# Patient Record
Sex: Female | Born: 1980 | Race: White | Hispanic: No | Marital: Married | Smoking: Never smoker
Health system: Southern US, Community
[De-identification: ages and names within clinical notes are randomized; demographics above are authoritative.]

## PROBLEM LIST (undated history)

## (undated) DIAGNOSIS — J45909 Unspecified asthma, uncomplicated: Secondary | ICD-10-CM

## (undated) DIAGNOSIS — A609 Anogenital herpesviral infection, unspecified: Secondary | ICD-10-CM

## (undated) HISTORY — DX: Anogenital herpesviral infection, unspecified: A60.9

## (undated) HISTORY — DX: Unspecified asthma, uncomplicated: J45.909

---

## 2016-10-08 ENCOUNTER — Ambulatory Visit (INDEPENDENT_AMBULATORY_CARE_PROVIDER_SITE_OTHER): Payer: Medicaid Other | Admitting: *Deleted

## 2016-10-08 DIAGNOSIS — Z3201 Encounter for pregnancy test, result positive: Secondary | ICD-10-CM | POA: Diagnosis not present

## 2016-10-08 DIAGNOSIS — N912 Amenorrhea, unspecified: Secondary | ICD-10-CM | POA: Diagnosis not present

## 2016-10-08 LAB — POCT URINE PREGNANCY: Preg Test, Ur: POSITIVE — AB

## 2016-11-16 ENCOUNTER — Ambulatory Visit (INDEPENDENT_AMBULATORY_CARE_PROVIDER_SITE_OTHER): Payer: Medicaid Other | Admitting: Certified Nurse Midwife

## 2016-11-16 ENCOUNTER — Encounter: Payer: Self-pay | Admitting: Certified Nurse Midwife

## 2016-11-16 ENCOUNTER — Other Ambulatory Visit (HOSPITAL_COMMUNITY)
Admission: RE | Admit: 2016-11-16 | Discharge: 2016-11-16 | Disposition: A | Discharge status: Home / Self Care | Payer: Medicaid Other | Source: Ambulatory Visit | Attending: Certified Nurse Midwife | Admitting: Certified Nurse Midwife

## 2016-11-16 VITALS — BP 125/82 | HR 98 | Ht 66.0 in | Wt 277.0 lb

## 2016-11-16 DIAGNOSIS — O09299 Supervision of pregnancy with other poor reproductive or obstetric history, unspecified trimester: Secondary | ICD-10-CM | POA: Insufficient documentation

## 2016-11-16 DIAGNOSIS — Z1151 Encounter for screening for human papillomavirus (HPV): Secondary | ICD-10-CM | POA: Diagnosis present

## 2016-11-16 DIAGNOSIS — B373 Candidiasis of vulva and vagina: Secondary | ICD-10-CM

## 2016-11-16 DIAGNOSIS — Z3480 Encounter for supervision of other normal pregnancy, unspecified trimester: Secondary | ICD-10-CM | POA: Insufficient documentation

## 2016-11-16 DIAGNOSIS — Z98891 History of uterine scar from previous surgery: Secondary | ICD-10-CM

## 2016-11-16 DIAGNOSIS — O09291 Supervision of pregnancy with other poor reproductive or obstetric history, first trimester: Secondary | ICD-10-CM

## 2016-11-16 DIAGNOSIS — B3731 Acute candidiasis of vulva and vagina: Secondary | ICD-10-CM

## 2016-11-16 DIAGNOSIS — A6009 Herpesviral infection of other urogenital tract: Secondary | ICD-10-CM | POA: Insufficient documentation

## 2016-11-16 DIAGNOSIS — Z01419 Encounter for gynecological examination (general) (routine) without abnormal findings: Secondary | ICD-10-CM | POA: Insufficient documentation

## 2016-11-16 DIAGNOSIS — O98311 Other infections with a predominantly sexual mode of transmission complicating pregnancy, first trimester: Secondary | ICD-10-CM

## 2016-11-16 DIAGNOSIS — O98811 Other maternal infectious and parasitic diseases complicating pregnancy, first trimester: Secondary | ICD-10-CM

## 2016-11-16 LAB — POCT URINALYSIS DIPSTICK
Bilirubin, UA: NEGATIVE
Glucose, UA: NEGATIVE
Leukocytes, UA: NEGATIVE
Nitrite, UA: NEGATIVE
PROTEIN UA: NEGATIVE
RBC UA: NEGATIVE
SPEC GRAV UA: 1.02
UROBILINOGEN UA: NEGATIVE
pH, UA: 5

## 2016-11-16 MED ORDER — TERCONAZOLE 0.8 % VA CREA: 1.0000 | TOPICAL_CREAM | Freq: Every day | VAGINAL | 0 refills | Status: DC

## 2016-11-16 MED ORDER — CITRANATAL 90 DHA 90-1 & 300 MG PO MISC: 1.0000 | Freq: Every day | ORAL | 6 refills | Status: DC

## 2016-11-16 NOTE — Progress Notes (Addendum)
Subjective:    Jean Fox is being seen today for her first obstetrical visit.  This is a planned pregnancy. She is at 6722w0d gestation. Her obstetrical history is significant for 3 previous C-sections. Relationship with FOB: spouse, living together. Patient does intend to breast feed. Pregnancy history fully reviewed.  She has a son with Tay-Sachs disease.    The information documented in the HPI was reviewed and verified.  Menstrual History: OB History    Gravida Para Term Preterm AB Living   4 3 3     3    SAB TAB Ectopic Multiple Live Births           3       Patient's last menstrual period was 08/24/2016.    Past Medical History:  Diagnosis Date  . HSV (herpes simplex virus) anogenital infection     Past Surgical History:  Procedure Laterality Date  . CESAREAN SECTION       (Not in a hospital admission) Not on File  Social History  Substance Use Topics  . Smoking status: Never Smoker  . Smokeless tobacco: Never Used  . Alcohol use No    Family History  Problem Relation Age of Onset  . Hypertension Mother   . Hypertension Maternal Grandmother      Review of Systems Constitutional: negative for weight loss Gastrointestinal: negative for vomiting Genitourinary:negative for genital lesions and vaginal discharge and dysuria Musculoskeletal:negative for back pain Behavioral/Psych: negative for abusive relationship, depression, illegal drug usage and tobacco use    Objective:    BP 125/82   Pulse 98   Ht 5\' 6"  (1.676 m)   Wt 277 lb (125.6 kg)   LMP 08/24/2016   BMI 44.71 kg/m  General Appearance:    Alert, cooperative, no distress, appears stated age  Head:    Normocephalic, without obvious abnormality, atraumatic  Eyes:    PERRL, conjunctiva/corneas clear, EOM's intact, fundi    benign, both eyes  Ears:    Normal TM's and external ear canals, both ears  Nose:   Nares normal, septum midline, mucosa normal, no drainage    or sinus tenderness  Throat:    Lips, mucosa, and tongue normal; teeth and gums normal  Neck:   Supple, symmetrical, trachea midline, no adenopathy;    thyroid:  no enlargement/tenderness/nodules; no carotid   bruit or JVD  Back:     Symmetric, no curvature, ROM normal, no CVA tenderness  Lungs:     Clear to auscultation bilaterally, respirations unlabored  Chest Wall:    No tenderness or deformity   Heart:    Regular rate and rhythm, S1 and S2 normal, no murmur, rub   or gallop  Breast Exam:    No tenderness, masses, or nipple abnormality  Abdomen:     Soft, non-tender, bowel sounds active all four quadrants,    no masses, no organomegaly  Genitalia:    Normal female without lesion, discharge or tenderness  Extremities:   Extremities normal, atraumatic, no cyanosis or edema  Pulses:   2+ and symmetric all extremities  Skin:   Skin color, texture, turgor normal, no rashes or lesions  Lymph nodes:   Cervical, supraclavicular, and axillary nodes normal  Neurologic:   CNII-XII intact, normal strength, sensation and reflexes    throughout        Cervix: long, thick, closed and posterior.     FH: less than U.  Lab Review Urine pregnancy test Labs reviewed no Radiologic studies reviewed no  Assessment:    Pregnancy at 766w0d weeks   Morbid maternal obesity  H/O child with Tay-Sach's versus carrier?   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   US to confirm dating, hx of twins on both sides of the family.   Prenatal vitamins.  Counseling provided regarding continued use of seat belts, cessation of alcohol consumption, smoking or use of illicit drugs; infection precautions i.e., influenza/TDAP immunizations, toxoplasmosis,CMV, parvovirus, listeria and varicella; workplace safety, exercise during pregnancy; routine dental care, safe medications, sexual activity, hot tubs, saunas, pools, travel,  caffeine use, fish and methlymercury, potential toxins, hair treatments, varicose veins Weight gain recommendations per IOM guidelines reviewed: underweight/BMI< 18.5--> gain 28 - 40 lbs; normal weight/BMI 18.5 - 24.9--> gain 25 - 35 lbs; overweight/BMI 25 - 29.9--> gain 15 - 25 lbs; obese/BMI >30->gain  11 - 20 lbs Problem list reviewed and updated. FIRST/CF mutation testing/NIPT/QUAD SCREEN/fragile X/Ashkenazi Jewish population testing/Spinal muscular atrophy discussed: ordered. Role of ultrasound in pregnancy discussed; fetal survey: requested. Amniocentesis discussed: not indicated. VBAC calculator score: VBAC consent form provided Meds ordered this encounter  Medications  . Prenatal Vit-Fe Fumarate-FA (MULTIVITAMIN-PRENATAL) 27-0.8 MG TABS tablet    Sig: Take 1 tablet by mouth daily at 12 noon.  . Prenat w/o A-FeCbGl-DSS-FA-DHA (CITRANATAL 90 DHA) 90-1 & 300 MG MISC    Sig: Take 1 tablet by mouth daily.    Dispense:  60 each    Refill:  6  . terconazole (TERAZOL 3) 0.8 % vaginal cream    Sig: Place 1 applicator vaginally at bedtime.    Dispense:  20 g    Refill:  0   Orders Placed This Encounter  Procedures  . Culture, OB Urine  . US MFM OB Comp Less 14 Wks    Standing Status:   Future    Standing Expiration Date:   01/16/2018    Order Specific Question:   Reason for Exam (SYMPTOM  OR DIAGNOSIS REQUIRED)    Answer:   dating, hx of twins, morbid obesity, previous c-section X3    Order Specific Question:   Preferred imaging location?    Answer:   [facility]  . Hemoglobinopathy evaluation  . Varicella zoster antibody, IgG  . VITAMIN D 25 Hydroxy (Vit-D Deficiency, Fractures)  . Hemoglobin A1c  . Obstetric Panel, Including HIV  . Cystic Fibrosis Mutation 97  . MaterniT Genome    Order Specific Question:   Is the patient insulin dependent?    Answer:   No    Order Specific Question:   Please enter gestational age. This should be expressed as weeks AND days, i.e. 16w 6d.  Enter weeks here. Enter days in next question.    Answer:   8612    Order Specific Question:   Please enter gestational age. This should be expressed as weeks AND days, i.e. 16w 6d. Enter days here. Enter weeks in previous question.    Answer:   0    Order Specific Question:   How was gestational age calculated?    Answer:   LMP    Order Specific Question:   Please give the date of LMP OR Ultrasound OR Estimated date of delivery.    Answer:   05/31/2017    Order Specific Question:   Number of Fetuses (Type of Pregnancy):    Answer:   1  Order Specific Question:   Indications for performing the test? (please choose all that apply):    Answer:   Routine screening    Order Specific Question:   Other Indications? (Y=Yes, N=No)    Answer:   Y    Order Specific Question:   Please specify other indications, if any:    Answer:   history of tay-sachs disease    Order Specific Question:   If this is a repeat specimen, please indicate the reason:    Answer:   Not indicated    Order Specific Question:   Please specify the patient's race: (C=White/Caucasion, B=Black, I=Native American, A=Asian, H=Hispanic, O=Other, U=Unknown)    Answer:   C    Order Specific Question:   Donor Egg - indicate if the egg was obtained from in vitro fertilization.    Answer:   N    Order Specific Question:   Age of Egg Donor.    Answer:   48    Order Specific Question:   Prior Down Syndrome/ONTD screening during current pregnancy.    Answer:   N    Order Specific Question:   Prior First Trimester Testing    Answer:   N    Order Specific Question:   Prior Second Trimester Testing    Answer:   N    Order Specific Question:   Family History of Neural Tube Defects    Answer:   N    Order Specific Question:   Prior Pregnancy with Down Syndrome    Answer:   N    Order Specific Question:   Please give the patient's weight (in pounds)    Answer:   277  . ToxASSURE Select 13 (MW), Urine  . AMB MFM GENETICS REFERRAL     Referral Priority:   Routine    Referral Type:   Consultation    Referral Reason:   Specialty Services Required    Number of Visits Requested:   1  . POCT urinalysis dipstick    Follow up in 4 weeks. 50% of 30 min visit spent on counseling and coordination of care.

## 2016-11-18 LAB — URINE CULTURE, OB REFLEX

## 2016-11-18 LAB — CULTURE, OB URINE

## 2016-11-19 LAB — CYTOLOGY - PAP
Diagnosis: NEGATIVE
HPV (WINDOPATH): NOT DETECTED

## 2016-11-20 LAB — NUSWAB VG+, CANDIDA 6SP
CANDIDA KRUSEI, NAA: NEGATIVE
CANDIDA LUSITANIAE, NAA: NEGATIVE
CANDIDA PARAPSILOSIS, NAA: NEGATIVE
CANDIDA TROPICALIS, NAA: NEGATIVE
CHLAMYDIA TRACHOMATIS, NAA: NEGATIVE
Candida albicans, NAA: POSITIVE — AB
Candida glabrata, NAA: NEGATIVE
Neisseria gonorrhoeae, NAA: NEGATIVE
TRICH VAG BY NAA: NEGATIVE

## 2016-11-20 LAB — TOXASSURE SELECT 13 (MW), URINE

## 2016-11-23 ENCOUNTER — Other Ambulatory Visit: Payer: Self-pay | Admitting: Certified Nurse Midwife

## 2016-11-23 ENCOUNTER — Encounter (HOSPITAL_COMMUNITY): Payer: Self-pay | Admitting: Certified Nurse Midwife

## 2016-11-24 LAB — OBSTETRIC PANEL, INCLUDING HIV
Antibody Screen: NEGATIVE
BASOS ABS: 0 10*3/uL (ref 0.0–0.2)
BASOS: 0 %
EOS (ABSOLUTE): 0.2 10*3/uL (ref 0.0–0.4)
Eos: 2 %
HEMATOCRIT: 40.7 % (ref 34.0–46.6)
HEP B S AG: NEGATIVE
HIV SCREEN 4TH GENERATION: NONREACTIVE
Hemoglobin: 13.5 g/dL (ref 11.1–15.9)
Immature Grans (Abs): 0 10*3/uL (ref 0.0–0.1)
Immature Granulocytes: 0 %
Lymphocytes Absolute: 1.8 10*3/uL (ref 0.7–3.1)
Lymphs: 19 %
MCH: 28.7 pg (ref 26.6–33.0)
MCHC: 33.2 g/dL (ref 31.5–35.7)
MCV: 87 fL (ref 79–97)
MONOCYTES: 8 %
Monocytes Absolute: 0.7 10*3/uL (ref 0.1–0.9)
NEUTROS ABS: 6.7 10*3/uL (ref 1.4–7.0)
Neutrophils: 71 %
Platelets: 225 10*3/uL (ref 150–379)
RBC: 4.7 x10E6/uL (ref 3.77–5.28)
RDW: 14.1 % (ref 12.3–15.4)
RPR: NONREACTIVE
RUBELLA: 4.87 {index} (ref >0.99)
Rh Factor: POSITIVE
WBC: 9.4 10*3/uL (ref 3.4–10.8)

## 2016-11-24 LAB — HEMOGLOBIN A1C
Est. average glucose Bld gHb Est-mCnc: 100 mg/dL
Hgb A1c MFr Bld: 5.1 % (ref 4.8–5.6)

## 2016-11-24 LAB — HEMOGLOBINOPATHY EVALUATION
HGB C: 0 %
HGB S: 0 %
Hemoglobin A2 Quantitation: 2.5 % (ref 0.7–3.1)
Hemoglobin F Quantitation: 0 % (ref 0.0–2.0)
Hgb A: 97.5 % (ref 94.0–98.0)

## 2016-11-24 LAB — VITAMIN D 25 HYDROXY (VIT D DEFICIENCY, FRACTURES): Vit D, 25-Hydroxy: 22.6 ng/mL — ABNORMAL LOW (ref 30.0–100.0)

## 2016-11-24 LAB — TAY-SACHS DISEASE PROFILE

## 2016-11-24 LAB — CYSTIC FIBROSIS MUTATION 97: Interpretation: NOT DETECTED

## 2016-11-24 LAB — VARICELLA ZOSTER ANTIBODY, IGG: VARICELLA: 1979 {index} (ref >165)

## 2016-11-28 LAB — MATERNIT GENOME

## 2016-12-02 ENCOUNTER — Other Ambulatory Visit: Payer: Self-pay | Admitting: Certified Nurse Midwife

## 2016-12-02 ENCOUNTER — Encounter (HOSPITAL_COMMUNITY): Payer: Self-pay

## 2016-12-02 ENCOUNTER — Ambulatory Visit (HOSPITAL_COMMUNITY)
Admission: RE | Admit: 2016-12-02 | Discharge: 2016-12-02 | Disposition: A | Discharge status: Home / Self Care | Payer: Medicaid Other | Source: Ambulatory Visit | Attending: Certified Nurse Midwife | Admitting: Certified Nurse Midwife

## 2016-12-02 ENCOUNTER — Other Ambulatory Visit (HOSPITAL_COMMUNITY): Payer: Self-pay | Admitting: *Deleted

## 2016-12-02 DIAGNOSIS — O99210 Obesity complicating pregnancy, unspecified trimester: Secondary | ICD-10-CM

## 2016-12-02 DIAGNOSIS — Z98891 History of uterine scar from previous surgery: Secondary | ICD-10-CM

## 2016-12-02 DIAGNOSIS — E7502 Tay-Sachs disease: Secondary | ICD-10-CM | POA: Insufficient documentation

## 2016-12-02 DIAGNOSIS — O99212 Obesity complicating pregnancy, second trimester: Secondary | ICD-10-CM

## 2016-12-02 DIAGNOSIS — O26892 Other specified pregnancy related conditions, second trimester: Secondary | ICD-10-CM | POA: Insufficient documentation

## 2016-12-02 DIAGNOSIS — Z3A14 14 weeks gestation of pregnancy: Secondary | ICD-10-CM

## 2016-12-02 DIAGNOSIS — O09299 Supervision of pregnancy with other poor reproductive or obstetric history, unspecified trimester: Secondary | ICD-10-CM

## 2016-12-02 DIAGNOSIS — Z3480 Encounter for supervision of other normal pregnancy, unspecified trimester: Secondary | ICD-10-CM

## 2016-12-02 DIAGNOSIS — Z315 Encounter for genetic counseling: Secondary | ICD-10-CM | POA: Insufficient documentation

## 2016-12-02 DIAGNOSIS — Z8481 Family history of carrier of genetic disease: Secondary | ICD-10-CM | POA: Insufficient documentation

## 2016-12-02 NOTE — Progress Notes (Signed)
Genetic Counseling  High-Risk Gestation Note  Appointment Date:  12/02/2016 Referred By: Morene Crocker, CNM Date of Birth:  [birth year] Partner:  Wyvonne Lenz   Pregnancy History: [id number] Estimated Date of Delivery: 05/31/17 Estimated Gestational Age: 19w2dAttending: MRenella Cunas MD  I met with Jean Fox for genetic counseling because of Tay-Sachs carrier status for her husband and for the couple's son.   In summary:  Discussed family history of Tay-Sachs carrier status  Couple's oldest child found to be a carrier of Tay-Sachs through genetic testing through neurology  Carrier screening was within normal limits for Ms. 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)  Given current available information, risk for Tay-Sachs in current pregnancy is 1 in 135 Offered carrier screening via gene sequencing to Jean Fox, which has >99% detection, versus 25% detection rate of common mutation analysis  She elected to pursue carrier screening Tay-Sachs, via HEXA gene sequencing today via [facility]  Couple's son has mild autism and right foot cavus deformity  [place]  Reviewed recurrence risk for autism approximately 13.5%, but can vary based on etiology  Patient understands prenatal screening and testing not available in absence in known etiology  Reviewed NIPS (ME[place] which was within normal range  Discussed general population carrier screening options  CF- previously performed, within normal range  SMA- elected to pursue today via [facility]  Hemoglobinopathies- previously performed, within normal range  We began by reviewing the family history in detail. Ms. BBelfiorereported that the couple's oldest child, a son, has mild autism and right  foot cavus deformity. He is currently 33years old. [place]. No specific etiology has been determined for his autism. Through the genetic workup, he was identified to be a carrier of TDyann Kiefdisease (Hexosaminidase A deficiency). Subsequent carrier screening for Jean Fox and her partner, [name] identified Mr. VDahlia Clientto also be a carrier for TDyann Kiefdisease. Ms. BCoaxumreported that her carrier screening at that time was negative. We do not have medical documentation of these tests at this time to confirm this report and to confirm whether the TDyann Kiefcarrier testing was via mutation analysis and/or enzyme analysis. The patient reported that there are no known individuals with TDyann Kiefdisease in the family; however, she reported that Mr. VDahlia Clienthad two siblings die at age less than 1 year. There is limited information regarding these relatives given [place] and [name]'s parents are both deceased. Mr. VDahlia Clientis one of 9 individuals in the sibship. Ms. BKotulareported Northern European ancestry. She reported no known [place]kenazi [place]. Consanguinity to Mr. VDahlia Clientwas denied. Ms. BAdderlyreported that she [place] We do not have medical records regarding this visit at this time.   Tay-Sachs disease is a neurodegenerative disease caused by lysosomal storage of GM2 ganglioside. Tay-Sachs disease is also known as the acute infantile variant of hexosaminidase A deficiency. Characteristics of Tay-Sachs deficiency include progressive weakness, loss of motor skills, increased startle response, progressive neurodegeneration with death typically by age 6735years. Juvenile (subacute), chronic, and adult-onset variants of hexosaminidase A deficiency have  later onsets, slower progression, and more variable neurologic findings. We reviewed genes, chromosomes, and autosomal recessive inheritance with the patient. Hexosaminidase A deficiency follows autosomal recessive inheritance, meaning that individuals typically inherit the condition from carrier parents. Carrier status refers to having one nonworking gene of a particular gene pair. For a carrier couple, each pregnancy together has a 1 in 4 (25%) chance to inherit both nonworking gene changes and be affected by the condition. Each pregnancy together also has a 1 in 2 (50%) chance for carrier status and a 1 in 4 (25%) chance to be unaffected and not be a carrier. Diagnosis of hexosaminidase A deficiency is typically done via demonstration of absent to near-absent beta-hexosaminidase A enzymatic activity in the serum or leukocytes of symptomatic individuals. Molecular genetic testing of HEXA is available to identify disease causing genes. Pseudodeficiency alleles in HEXA cause deficient HEXA enzyme activity but do not cause neurologic abnormality.   Carrier frequency for Tay-Sachs has been shown to vary among different populations. Carrier frequency of approximately 1 in 51 has been reported for individuals with Ashkenazi [place] or [place] ancestry. Carrier frequency is approximately 100 times less in individuals without Ashkenazi Jewish ancestry. Carrier screening for hexosaminidase A deficiency is typically done via enzyme testing and molecular testing.  [name] had Tay-Sachs carrier screening via targeted mutation analysis performed through [facility], facilitated through her OB office, which was within normal range. Targeted mutation analysis for HEXA has an approximate 25% detection rate for individuals without Ashkenazi [place] or [place] ancestry. Thus, this negative screen reduces the risk to be a carrier from 1 in 300 to 1 in 400. Thus, given the available information, the risk for Tay-Sachs  disease in the current pregnancy is approximately 1 in 1600. We discussed that in theory, the risk is likely lower given the couple's three apparently unaffected children together. We discussed the further carrier screening option of gene sequencing for HEXA, which has an approximate 99% detection rate. Ms. Borys elected to pursue carrier screening for Tay-Sachs via HEXA gene sequencing through [facility] at the time of today's visit. We discussed that in the case that a carrier couple is identified via molecular testing, prenatal diagnosis would be available via amniocentesis. We reviewed risks, benefits, and limitations of amniocentesis. Ms. Woolworth indicated she would not be interested in amniocentesis in pregnancy.   We also spent time discussing that autism is part of the spectrum of conditions referred to as Autistic spectrum disorders (ASD). We discussed that ASDs are among the most common neurodevelopmental disorders, with approximately 1 in 68 children meeting criteria for ASD, according to the Centers for Disease Control. Approximately 80% of individuals diagnosed are female. There is strong evidence that genetic factors play a critical role in development of ASD. There have been recent advances in identifying specific genetic causes of ASD, however, there are still many individuals for whom the etiology of the ASD is not known. The majority of individuals with ASD (70-80%) have essential autism. There is strong evidence that genetic factors play a critical role in development of ASD. Some individuals with ASDs are found to have causative differences in karyotype analysis, chromosomal microarray analysis, or single genes. These are more likely to be identified in individuals with complex autism spectrum disorders.     Once a family has a child with a diagnosis of ASD, there is a 13.5% chance to have another child with ASD. If the pregnancy is female the chance is approximately 9%, and  approximately 26% if the pregnancy is female. When there is more than one affected sibling, the recurrence chance is 32%. Therefore, based on what we know about this family, there should be an approximate 13.5% recurrence chance for this pregnancy. They understand that at this time there is not genetic testing available for ASD for most families. In the case of an identified genetic cause, recurrence risk estimate may change. The patient is aware that for many individuals with autism spectrum disorders an underlying genetic cause is not identified at this time. In the absence of an identified genetic etiology, prenatal screening or testing would not be available in the current pregnancy for the autism spectrum disorders in the family. However, the patient is aware that at this time screening that indicates fetal sex in pregnancy can further refine recurrence risk estimate for ASD in the case of likely multifactorial inheritance.  Additionally, Ms. Buckingham reported Mr. Dahlia Client had an additional sibling who also died at less than 1 year, described to be unable to eat. This was suspected to be due to a cleft palate. The patient specifically discussed that this relative did not have a cleft lip. The incidence of isolated cleft palate is estimated to be 1 in 2,500, and the incidence of cleft lip with or without cleft palate is approximately 1 in 1,000, varying with ethnicity. Cleft palate is most often an isolated condition, but can be present as one feature of an underlying genetic condition in combination with other birth defects or features. Clefting can also be associated with maternal environmental exposures during pregnancy. When there is no syndrome or known underlying factor as the cause, multifactorial inheritance is suspected involving a combination of genetic and environmental contributing factors. In the case of multifactorial inheritance, given the reported family history, recurrence risk for isolated  cleft palate in the current pregnancy is estimated to be less than 0.6%. If the relative's cleft palate were due to a specific underlying cause, this recurrence risk estimate may change. A second trimester targeted ultrasound may detect facial clefts. However, it is important to remember that not all clefting can be detected prenatally, and isolated cleft palate is difficult to detect on prenatal ultrasound.  The family histories were otherwise found to be noncontributory for birth defects, intellectual disability, and known genetic conditions. Without further information regarding the provided family history, an accurate genetic risk cannot be calculated. Further genetic counseling is warranted if more information is obtained.  Ms. Vanderwerf previously had noninvasive prenatal screening (NIPS)/prenatal cell free DNA testing through her OB office. Specifically, she had M[facility]niTGenome through [employer], which was within normal range for the conditions screened. We spent time reviewing the methodology of this screen, the conditions for which it assesses, and the detection rates. She understands that this is not diagnostic but is highly sensitive and specific. She understands this does not assess for all chromosome conditions and does not assess for single gene conditions.   Detailed ultrasound is scheduled for 01/07/17. She understands that ultrasound cannot diagnose or rule out all birth defects or genetic conditions.   Ms. Avalynn Bowe was provided with written information regarding cystic fibrosis (CF), spinal muscular atrophy (SMA) and hemoglobinopathies including the carrier frequency, availability of carrier screening and prenatal diagnosis if indicated.  In addition, we discussed that CF and hemoglobinopathies are routinely screened for as part of the [facility] newborn screening panel. Ms. Loth previously had CF carrier screening and hemoglobin electrophoresis performed through her OB provider,  and both were within  normal range.  After further discussion, she elected to pursue SMA carrier screening today through [facility].  Ms. Megahn Killings denied exposure to environmental toxins or chemical agents. She denied the use of alcohol, tobacco or street drugs. She denied significant viral illnesses during the course of her pregnancy. Her medical and surgical histories were noncontributory.   I counseled Ms. Ryland Lobosco regarding the above risks and available options.  The approximate face-to-face time with the genetic counselor was 45 minutes.  Chipper Oman, MS Certified Genetic Counselor 12/02/2016

## 2016-12-03 ENCOUNTER — Other Ambulatory Visit: Payer: Self-pay | Admitting: Certified Nurse Midwife

## 2016-12-03 ENCOUNTER — Encounter: Payer: Self-pay | Admitting: Certified Nurse Midwife

## 2016-12-03 ENCOUNTER — Other Ambulatory Visit (HOSPITAL_COMMUNITY): Payer: Self-pay

## 2016-12-03 DIAGNOSIS — O09299 Supervision of pregnancy with other poor reproductive or obstetric history, unspecified trimester: Secondary | ICD-10-CM

## 2016-12-03 DIAGNOSIS — Z3480 Encounter for supervision of other normal pregnancy, unspecified trimester: Secondary | ICD-10-CM

## 2016-12-03 DIAGNOSIS — Z3A14 14 weeks gestation of pregnancy: Secondary | ICD-10-CM

## 2016-12-03 DIAGNOSIS — Z98891 History of uterine scar from previous surgery: Secondary | ICD-10-CM

## 2016-12-03 DIAGNOSIS — O99212 Obesity complicating pregnancy, second trimester: Secondary | ICD-10-CM

## 2016-12-06 ENCOUNTER — Other Ambulatory Visit: Payer: Self-pay | Admitting: Certified Nurse Midwife

## 2016-12-06 DIAGNOSIS — Z3480 Encounter for supervision of other normal pregnancy, unspecified trimester: Secondary | ICD-10-CM

## 2016-12-10 ENCOUNTER — Other Ambulatory Visit: Payer: Self-pay | Admitting: Certified Nurse Midwife

## 2016-12-14 ENCOUNTER — Ambulatory Visit (INDEPENDENT_AMBULATORY_CARE_PROVIDER_SITE_OTHER): Payer: Medicaid Other | Admitting: Certified Nurse Midwife

## 2016-12-14 ENCOUNTER — Encounter: Payer: Self-pay | Admitting: Certified Nurse Midwife

## 2016-12-14 VITALS — BP 125/81 | HR 82 | Wt 277.0 lb

## 2016-12-14 DIAGNOSIS — Z23 Encounter for immunization: Secondary | ICD-10-CM

## 2016-12-14 DIAGNOSIS — L309 Dermatitis, unspecified: Secondary | ICD-10-CM

## 2016-12-14 DIAGNOSIS — Z3480 Encounter for supervision of other normal pregnancy, unspecified trimester: Secondary | ICD-10-CM

## 2016-12-14 DIAGNOSIS — L301 Dyshidrosis [pompholyx]: Secondary | ICD-10-CM

## 2016-12-14 MED ORDER — TRIAMCINOLONE ACETONIDE 0.1 % EX OINT: 1.0000 "application " | TOPICAL_OINTMENT | Freq: Two times a day (BID) | CUTANEOUS | 99 refills | Status: DC

## 2016-12-14 NOTE — Patient Instructions (Addendum)
Atopic Dermatitis Atopic dermatitis is a skin disorder that causes inflammation of the skin. This is the most common type of eczema. Eczema is a group of skin conditions that cause the skin to be itchy, red, and swollen. This condition is generally worse during the cooler winter months and often improves during the warm summer months. Symptoms can vary from person to person. Atopic dermatitis usually starts showing signs in infancy and can last through adulthood. This condition cannot be passed from one person to another (non-contagious), but is more common in families. Atopic dermatitis may not always be present. When it is present, it is called a flare-up. What are the causes? The exact cause of this condition is not known. Flare-ups of the condition may be triggered by:  Contact with something you are sensitive or allergic to.  Stress.  Certain foods.  Extremely hot or cold weather.  Harsh chemicals and soaps.  Dry air.  Chlorine. What increases the risk? This condition is more likely to develop in people who have a personal history or family history of eczema, allergies, asthma, or hay fever. What are the signs or symptoms? Symptoms of this condition include:  Dry, scaly skin.  Red, itchy rash.  Itchiness, which can be severe. This may occur before the skin rash. This can make sleeping difficult.  Skin thickening and cracking can occur over time. How is this diagnosed? This condition is diagnosed based on your symptoms, a medical history, and a physical exam. How is this treated? There is no cure for this condition, but symptoms can usually be controlled. Treatment focuses on:  Controlling the itching and scratching. You may be given medicines, such as antihistamines or steroid creams.  Limiting exposure to things that you are sensitive or allergic to (allergens).  Recognizing situations that cause stress and developing a plan to manage stress. If your atopic dermatitis  does not get better with medicines or is all over your body (widespread) , a treatment using a specific type of light (phototherapy) may be used. Follow these instructions at home: Skin care  Keep your skin well-moisturized. This seals in moisture and help prevent dryness.  Use unscented lotions that have petroleum in them.  Avoid lotions that contain alcohol and water. They can dry the skin.  Keep baths or showers short (less than 5 minutes) in warm water. Do not use hot water.  Use mild, unscented cleansers for bathing. Avoid soap and bubble bath.  Apply a moisturizer to your skin right after a bath or shower.   Do not apply anything to your skin without checking with your health care provider. General instructions  Dress in clothes made of cotton or cotton blends. Dress lightly because heat increases itching.  When washing your clothes, rinse your clothes twice so all of the soap is removed.  Avoid any triggers that can cause a flare-up.  Try to manage your stress.  Keep your fingernails cut short.  Avoid scratching. Scratching makes the rash and itching worse. It may also result in a skin infection (impetigo) due to a break in the skin caused by scratching.  Take or apply over-the-counter and prescription medicines only as told by your health care provider.  Keep all follow-up visits as told by your health care provider. This is important.  Do not be around people who have cold sores or fever blisters. If you get the infection, it may cause your atopic dermatitis to worsen. Contact a health care provider if:  Your itching   interferes with sleep.  Your rash gets worse or is not better within one week of starting treatment.  You have a fever.  You have a rash flare-up after having contact with someone who has cold sores or fever blisters. Get help right away if:  You develop pus or soft yellow scabs in the rash area. Summary  This condition causes a red rash and  itchy, dry, scaly skin.  Treatment focuses on controlling the itching and scratching, limiting exposure to things that you are sensitive or allergic to (allergens), and recognizing situations that cause stress and developing a plan to manage stress.  Keep your skin well-moisturized.  Keep baths or showers less than 5 minutes. This information is not intended to replace advice given to you by your health care provider. Make sure you discuss any questions you have with your health care provider. Document Released: 12/03/2000 Document Revised: 05/13/2016 Document Reviewed: 07/09/2013 Elsevier Interactive Patient Education  2017 [employer] Second Trimester of Pregnancy The second trimester is from week 13 through week 28 (months 4 through 6). The second trimester is often a time when you feel your best. Your body has also adjusted to being pregnant, and you begin to feel better physically. Usually, morning sickness has lessened or quit completely, you may have more energy, and you may have an increase in appetite. The second trimester is also a time when the fetus is growing rapidly. At the end of the sixth month, the fetus is about 9 inches long and weighs about 1 pounds. You will likely begin to feel the baby move (quickening) between 18 and 20 weeks of the pregnancy. Body changes during your second trimester Your body continues to go through many changes during your second trimester. The changes vary from woman to woman.  Your weight will continue to increase. You will notice your lower abdomen bulging out.  You may begin to get stretch marks on your hips, abdomen, and breasts.  You may develop headaches that can be relieved by medicines. The medicines should be approved by your health care provider.  You may urinate more often because the fetus is pressing on your bladder.  You may develop or continue to have heartburn as a result of your pregnancy.  You may develop constipation because  certain hormones are causing the muscles that push waste through your intestines to slow down.  You may develop hemorrhoids or swollen, bulging veins (varicose veins).  You may have back pain. This is caused by:  Weight gain.  Pregnancy hormones that are relaxing the joints in your pelvis.  A shift in weight and the muscles that support your balance.  Your breasts will continue to grow and they will continue to become tender.  Your gums may bleed and may be sensitive to brushing and flossing.  Dark spots or blotches (chloasma, mask of pregnancy) may develop on your face. This will likely fade after the baby is born.  A dark line from your belly button to the pubic area (linea nigra) may appear. This will likely fade after the baby is born.  You may have changes in your hair. These can include thickening of your hair, rapid growth, and changes in texture. Some women also have hair loss during or after pregnancy, or hair that feels dry or thin. Your hair will most likely return to normal after your baby is born. What to expect at prenatal visits During a routine prenatal visit:  You will be weighed to make sure  you and the fetus are growing normally.  Your blood pressure will be taken.  Your abdomen will be measured to track your baby's growth.  The fetal heartbeat will be listened to.  Any test results from the previous visit will be discussed. Your health care provider may ask you:  How you are feeling.  If you are feeling the baby move.  If you have had any abnormal symptoms, such as leaking fluid, bleeding, severe headaches, or abdominal cramping.  If you are using any tobacco products, including cigarettes, chewing tobacco, and electronic cigarettes.  If you have any questions. Other tests that may be performed during your second trimester include:  Blood tests that check for:  Low iron levels (anemia).  Gestational diabetes (between 24 and 28 weeks).  Rh  antibodies. This is to check for a protein on red blood cells (Rh factor).  Urine tests to check for infections, diabetes, or protein in the urine.  An ultrasound to confirm the proper growth and development of the baby.  An amniocentesis to check for possible genetic problems.  Fetal screens for spina bifida and Down syndrome.  HIV (human immunodeficiency virus) testing. Routine prenatal testing includes screening for HIV, unless you choose not to have this test. Follow these instructions at home: Eating and drinking  Continue to eat regular, healthy meals.  Avoid raw meat, uncooked cheese, cat litter boxes, and soil used by cats. These carry germs that can cause birth defects in the baby.  Take your prenatal vitamins.  Take 1500-2000 mg of calcium daily starting at the 20th week of pregnancy until you deliver your baby.  If you develop constipation:  Take over-the-counter or prescription medicines.  Drink enough fluid to keep your urine clear or pale yellow.  Eat foods that are high in fiber, such as fresh fruits and vegetables, whole grains, and beans.  Limit foods that are high in fat and processed sugars, such as fried and sweet foods. Activity  Exercise only as directed by your health care provider. Experiencing uterine cramps is a good sign to stop exercising.  Avoid heavy lifting, wear low heel shoes, and practice good posture.  Wear your seat belt at all times when driving.  Rest with your legs elevated if you have leg cramps or low back pain.  Wear a good support bra for breast tenderness.  Do not use hot tubs, steam rooms, or saunas. Lifestyle  Avoid all smoking, herbs, alcohol, and unprescribed drugs. These chemicals affect the formation and growth of the baby.  Do not use any products that contain nicotine or tobacco, such as cigarettes and e-cigarettes. If you need help quitting, ask your health care provider.  A sexual relationship may be continued  unless your health care provider directs you otherwise. General instructions  Follow your health care provider's instructions regarding medicine use. There are medicines that are either safe or unsafe to take during pregnancy.  Take warm sitz baths to soothe any pain or discomfort caused by hemorrhoids. Use hemorrhoid cream if your health care provider approves.  If you develop varicose veins, wear support hose. Elevate your feet for 15 minutes, 3-4 times a day. Limit salt in your diet.  Visit your dentist if you have not gone yet during your pregnancy. Use a soft toothbrush to brush your teeth and be gentle when you floss.  Keep all follow-up prenatal visits as told by your health care provider. This is important. Contact a health care provider if:  You have  dizziness.  You have mild pelvic cramps, pelvic pressure, or nagging pain in the abdominal area.  You have persistent nausea, vomiting, or diarrhea.  You have a bad smelling vaginal discharge.  You have pain with urination. Get help right away if:  You have a fever.  You are leaking fluid from your vagina.  You have spotting or bleeding from your vagina.  You have severe abdominal cramping or pain.  You have rapid weight gain or weight loss.  You have shortness of breath with chest pain.  You notice sudden or extreme swelling of your face, hands, ankles, feet, or legs.  You have not felt your baby move in over an hour.  You have severe headaches that do not go away with medicine.  You have vision changes. Summary  The second trimester is from week 13 through week 28 (months 4 through 6). It is also a time when the fetus is growing rapidly.  Your body goes through many changes during pregnancy. The changes vary from woman to woman.  Avoid all smoking, herbs, alcohol, and unprescribed drugs. These chemicals affect the formation and growth your baby.  Do not use any tobacco products, such as cigarettes, chewing  tobacco, and e-cigarettes. If you need help quitting, ask your health care provider.  Contact your health care provider if you have any questions. Keep all prenatal visits as told by your health care provider. This is important. This information is not intended to replace advice given to you by your health care provider. Make sure you discuss any questions you have with your health care provider. Document Released: 11/30/2001 Document Revised: 05/13/2016 Document Reviewed: 02/06/2013 Elsevier Interactive Patient Education  2017 [employer].

## 2016-12-14 NOTE — Progress Notes (Signed)
  Subjective:    Jean Fox is a 35 y.o. female being seen today for her obstetrical visit. She is at 194w0d gestation. Patient reports: no complaints.  Does have hx of eczema on her hands and feet, apears more like psoriasis.    Problem List Items Addressed This Visit      Other   Supervision of other normal pregnancy, antepartum - Primary   Relevant Orders   Flu Vaccine QUAD 36+ mos IM (Completed)   AFP, Quad Screen    Other Visit Diagnoses    Encounter for immunization       Relevant Orders   Flu Vaccine QUAD 36+ mos IM (Completed)   AFP, Quad Screen   Vesicular foot eczema       Relevant Medications   triamcinolone ointment (KENALOG) 0.1 %   Other Relevant Orders   Ambulatory referral to Dermatology     Patient Active Problem List   Diagnosis Date Noted  . Family history of carrier of hereditary disease 12/02/2016  . Supervision of other normal pregnancy, antepartum 11/16/2016  . H/O cesarean section 11/16/2016  . Herpes genitalis in women 11/16/2016    Objective:     BP 125/81   Pulse 82   Wt 277 lb (125.6 kg)   LMP 08/24/2016   BMI 44.71 kg/m  Uterine Size: Below umbilicus   FHR:  160 by doppler.   Assessment:    Pregnancy @ 604w0d  weeks Doing well   Psoriasis versus eczema on plantar side of feet/hands   Plan:    Problem list reviewed and updated. Labs reviewed.  Follow up in 4 weeks. FIRST/CF mutation testing/NIPT/QUAD SCREEN/fragile X/Ashkenazi Jewish population testing/Spinal muscular atrophy discussed: results reviewed. Role of ultrasound in pregnancy discussed; fetal survey: scheduled. Amniocentesis discussed: not indicated. 50% of 15 minute visit spent on counseling and coordination of care.

## 2016-12-15 ENCOUNTER — Telehealth (HOSPITAL_COMMUNITY): Payer: Self-pay | Admitting: Genetics

## 2016-12-15 NOTE — Telephone Encounter (Signed)
Called Ms. Morash regarding her [facility] carrier screening results for SMA and Alfonzo Felleray Sachs disease.  Her voicemail requested that an email be sent if she did not pick up the phone.  I left a message stating that I would release her results to her through [facility] email portal and to call back if she had any questions or concerns.

## 2016-12-16 LAB — AFP, QUAD SCREEN
DIA Mom Value: 0.76
DIA Value (EIA): 99.66 pg/mL
DSR (BY AGE) 1 IN: 338
DSR (Second Trimester) 1 IN: 2318
Gestational Age: 16 WEEKS
MSAFP MOM: 0.79
MSAFP: 18.1 ng/mL
MSHCG MOM: 1.03
MSHCG: 28252 m[IU]/mL
Maternal Age At EDD: 34.4 YEARS
Osb Risk: 10000
T18 (By Age): 1:1319 {titer}
Test Results:: NEGATIVE
UE3 VALUE: 0.66 ng/mL
Weight: 277 [lb_av]
uE3 Mom: 0.94

## 2016-12-17 ENCOUNTER — Other Ambulatory Visit: Payer: Self-pay | Admitting: Certified Nurse Midwife

## 2016-12-17 DIAGNOSIS — Z3480 Encounter for supervision of other normal pregnancy, unspecified trimester: Secondary | ICD-10-CM

## 2017-01-07 ENCOUNTER — Other Ambulatory Visit (HOSPITAL_COMMUNITY): Payer: Self-pay | Admitting: *Deleted

## 2017-01-07 ENCOUNTER — Encounter (HOSPITAL_COMMUNITY): Payer: Self-pay

## 2017-01-07 ENCOUNTER — Ambulatory Visit (HOSPITAL_COMMUNITY)
Admission: RE | Admit: 2017-01-07 | Discharge: 2017-01-07 | Disposition: A | Discharge status: Home / Self Care | Payer: Medicaid Other | Source: Ambulatory Visit | Attending: Certified Nurse Midwife | Admitting: Certified Nurse Midwife

## 2017-01-07 DIAGNOSIS — O321XX0 Maternal care for breech presentation, not applicable or unspecified: Secondary | ICD-10-CM | POA: Insufficient documentation

## 2017-01-07 DIAGNOSIS — Z0489 Encounter for examination and observation for other specified reasons: Secondary | ICD-10-CM

## 2017-01-07 DIAGNOSIS — O4442 Low lying placenta NOS or without hemorrhage, second trimester: Secondary | ICD-10-CM | POA: Diagnosis not present

## 2017-01-07 DIAGNOSIS — Z8489 Family history of other specified conditions: Secondary | ICD-10-CM | POA: Diagnosis not present

## 2017-01-07 DIAGNOSIS — O99212 Obesity complicating pregnancy, second trimester: Secondary | ICD-10-CM | POA: Diagnosis present

## 2017-01-07 DIAGNOSIS — IMO0002 Reserved for concepts with insufficient information to code with codable children: Secondary | ICD-10-CM

## 2017-01-07 DIAGNOSIS — Z3A19 19 weeks gestation of pregnancy: Secondary | ICD-10-CM | POA: Insufficient documentation

## 2017-01-07 DIAGNOSIS — O99210 Obesity complicating pregnancy, unspecified trimester: Secondary | ICD-10-CM

## 2017-01-10 ENCOUNTER — Ambulatory Visit (INDEPENDENT_AMBULATORY_CARE_PROVIDER_SITE_OTHER): Payer: Medicaid Other | Admitting: Certified Nurse Midwife

## 2017-01-10 VITALS — BP 132/70 | Temp 97.8°F | Wt 279.8 lb

## 2017-01-10 DIAGNOSIS — O34219 Maternal care for unspecified type scar from previous cesarean delivery: Secondary | ICD-10-CM

## 2017-01-10 DIAGNOSIS — O98312 Other infections with a predominantly sexual mode of transmission complicating pregnancy, second trimester: Secondary | ICD-10-CM

## 2017-01-10 DIAGNOSIS — Z98891 History of uterine scar from previous surgery: Secondary | ICD-10-CM

## 2017-01-10 DIAGNOSIS — Z3480 Encounter for supervision of other normal pregnancy, unspecified trimester: Secondary | ICD-10-CM

## 2017-01-10 DIAGNOSIS — Z3482 Encounter for supervision of other normal pregnancy, second trimester: Secondary | ICD-10-CM

## 2017-01-10 DIAGNOSIS — Z8481 Family history of carrier of genetic disease: Secondary | ICD-10-CM

## 2017-01-10 DIAGNOSIS — A6009 Herpesviral infection of other urogenital tract: Secondary | ICD-10-CM

## 2017-01-10 NOTE — Patient Instructions (Addendum)
Second Trimester of Pregnancy The second trimester is from week 13 through week 28 (months 4 through 6). The second trimester is often a time when you feel your best. Your body has also adjusted to being pregnant, and you begin to feel better physically. Usually, morning sickness has lessened or quit completely, you may have more energy, and you may have an increase in appetite. The second trimester is also a time when the fetus is growing rapidly. At the end of the sixth month, the fetus is about 9 inches long and weighs about 1 pounds. You will likely begin to feel the baby move (quickening) between 18 and 20 weeks of the pregnancy. Body changes during your second trimester Your body continues to go through many changes during your second trimester. The changes vary from woman to woman.  Your weight will continue to increase. You will notice your lower abdomen bulging out.  You may begin to get stretch marks on your hips, abdomen, and breasts.  You may develop headaches that can be relieved by medicines. The medicines should be approved by your health care provider.  You may urinate more often because the fetus is pressing on your bladder.  You may develop or continue to have heartburn as a result of your pregnancy.  You may develop constipation because certain hormones are causing the muscles that push waste through your intestines to slow down.  You may develop hemorrhoids or swollen, bulging veins (varicose veins).  You may have back pain. This is caused by:  Weight gain.  Pregnancy hormones that are relaxing the joints in your pelvis.  A shift in weight and the muscles that support your balance.  Your breasts will continue to grow and they will continue to become tender.  Your gums may bleed and may be sensitive to brushing and flossing.  Dark spots or blotches (chloasma, mask of pregnancy) may develop on your face. This will likely fade after the baby is born.  A dark line  from your belly button to the pubic area (linea nigra) may appear. This will likely fade after the baby is born.  You may have changes in your hair. These can include thickening of your hair, rapid growth, and changes in texture. Some women also have hair loss during or after pregnancy, or hair that feels dry or thin. Your hair will most likely return to normal after your baby is born. What to expect at prenatal visits During a routine prenatal visit:  You will be weighed to make sure you and the fetus are growing normally.  Your blood pressure will be taken.  Your abdomen will be measured to track your baby's growth.  The fetal heartbeat will be listened to.  Any test results from the previous visit will be discussed. Your health care provider may ask you:  How you are feeling.  If you are feeling the baby move.  If you have had any abnormal symptoms, such as leaking fluid, bleeding, severe headaches, or abdominal cramping.  If you are using any tobacco products, including cigarettes, chewing tobacco, and electronic cigarettes.  If you have any questions. Other tests that may be performed during your second trimester include:  Blood tests that check for:  Low iron levels (anemia).  Gestational diabetes (between 24 and 28 weeks).  Rh antibodies. This is to check for a protein on red blood cells (Rh factor).  Urine tests to check for infections, diabetes, or protein in the urine.  An ultrasound to   confirm the proper growth and development of the baby.  An amniocentesis to check for possible genetic problems.  Fetal screens for spina bifida and Down syndrome.  HIV (human immunodeficiency virus) testing. Routine prenatal testing includes screening for HIV, unless you choose not to have this test. Follow these instructions at home: Eating and drinking  Continue to eat regular, healthy meals.  Avoid raw meat, uncooked cheese, cat litter boxes, and soil used by cats. These  carry germs that can cause birth defects in the baby.  Take your prenatal vitamins.  Take 1500-2000 mg of calcium daily starting at the 20th week of pregnancy until you deliver your baby.  If you develop constipation:  Take over-the-counter or prescription medicines.  Drink enough fluid to keep your urine clear or pale yellow.  Eat foods that are high in fiber, such as fresh fruits and vegetables, whole grains, and beans.  Limit foods that are high in fat and processed sugars, such as fried and sweet foods. Activity  Exercise only as directed by your health care provider. Experiencing uterine cramps is a good sign to stop exercising.  Avoid heavy lifting, wear low heel shoes, and practice good posture.  Wear your seat belt at all times when driving.  Rest with your legs elevated if you have leg cramps or low back pain.  Wear a good support bra for breast tenderness.  Do not use hot tubs, steam rooms, or saunas. Lifestyle  Avoid all smoking, herbs, alcohol, and unprescribed drugs. These chemicals affect the formation and growth of the baby.  Do not use any products that contain nicotine or tobacco, such as cigarettes and e-cigarettes. If you need help quitting, ask your health care provider.  A sexual relationship may be continued unless your health care provider directs you otherwise. General instructions  Follow your health care provider's instructions regarding medicine use. There are medicines that are either safe or unsafe to take during pregnancy.  Take warm sitz baths to soothe any pain or discomfort caused by hemorrhoids. Use hemorrhoid cream if your health care provider approves.  If you develop varicose veins, wear support hose. Elevate your feet for 15 minutes, 3-4 times a day. Limit salt in your diet.  Visit your dentist if you have not gone yet during your pregnancy. Use a soft toothbrush to brush your teeth and be gentle when you floss.  Keep all follow-up  prenatal visits as told by your health care provider. This is important. Contact a health care provider if:  You have dizziness.  You have mild pelvic cramps, pelvic pressure, or nagging pain in the abdominal area.  You have persistent nausea, vomiting, or diarrhea.  You have a bad smelling vaginal discharge.  You have pain with urination. Get help right away if:  You have a fever.  You are leaking fluid from your vagina.  You have spotting or bleeding from your vagina.  You have severe abdominal cramping or pain.  You have rapid weight gain or weight loss.  You have shortness of breath with chest pain.  You notice sudden or extreme swelling of your face, hands, ankles, feet, or legs.  You have not felt your baby move in over an hour.  You have severe headaches that do not go away with medicine.  You have vision changes. Summary  The second trimester is from week 13 through week 28 (months 4 through 6). It is also a time when the fetus is growing rapidly.  Your body goes   through many changes during pregnancy. The changes vary from woman to woman.  Avoid all smoking, herbs, alcohol, and unprescribed drugs. These chemicals affect the formation and growth your baby.  Do not use any tobacco products, such as cigarettes, chewing tobacco, and e-cigarettes. If you need help quitting, ask your health care provider.  Contact your health care provider if you have any questions. Keep all prenatal visits as told by your health care provider. This is important. This information is not intended to replace advice given to you by your health care provider. Make sure you discuss any questions you have with your health care provider. Document Released: 11/30/2001 Document Revised: 05/13/2016 Document Reviewed: 02/06/2013 Elsevier Interactive Patient Education  2017 [employer] Inc[employer]  

## 2017-01-10 NOTE — Progress Notes (Signed)
   PRENATAL VISIT NOTE  Subjective:  Jean Fox is a 36 y.o. [id number] at 7290w6d being seen today for ongoing prenatal care.  She is currently monitored for the following issues for this low-risk pregnancy and has Supervision of other normal pregnancy, antepartum; H/O cesarean section; Herpes genitalis in women; and Family history of carrier of hereditary disease on her problem list.  Patient reports no complaints.  Contractions: Not present. Vag. Bleeding: None.  Movement: Present. Denies leaking of fluid.   The following portions of the patient's history were reviewed and updated as appropriate: allergies, current medications, past family history, past medical history, past social history, past surgical history and problem list. Problem list updated.  Objective:   Vitals:   01/10/17 1522  BP: 132/70  Temp: 97.8 F (36.6 C)  Weight: 279 lb 12.8 oz (126.9 kg)    Fetal Status: Fetal Heart Rate (bpm): 153 Fundal Height: 22 cm Movement: Present     General:  Alert, oriented and cooperative. Patient is in no acute distress.  Skin: Skin is warm and dry. No rash noted.   Cardiovascular: Normal heart rate noted  Respiratory: Normal respiratory effort, no problems with respiration noted  Abdomen: Soft, gravid, appropriate for gestational age. Pain/Pressure: Absent     Pelvic:  Cervical exam deferred        Extremities: Normal range of motion.  Edema: None  Mental Status: Normal mood and affect. Normal behavior. Normal judgment and thought content.   Assessment and Plan:  Pregnancy: [id number] at 4490w6d  1. Herpes genitalis in women     Needs Valtrex @36  weeks  2. H/O cesarean section     Repeat C-section planned.   3. Supervision of other normal pregnancy, antepartum     No complaints  4. 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 Please refer to After Visit Summary for other counseling recommendations.  Return in about 4 weeks (around 02/07/2017) for ROB.   Roe Coombsachelle A Tetsuo Coppola, CNM

## 2017-01-11 ENCOUNTER — Encounter: Payer: Medicaid Other | Admitting: Certified Nurse Midwife

## 2017-02-04 ENCOUNTER — Ambulatory Visit (HOSPITAL_COMMUNITY)
Admission: RE | Admit: 2017-02-04 | Discharge: 2017-02-04 | Disposition: A | Discharge status: Home / Self Care | Payer: Medicaid Other | Source: Ambulatory Visit | Attending: Certified Nurse Midwife | Admitting: Certified Nurse Midwife

## 2017-02-08 ENCOUNTER — Encounter: Payer: Medicaid Other | Admitting: Obstetrics and Gynecology

## 2017-02-15 ENCOUNTER — Ambulatory Visit (INDEPENDENT_AMBULATORY_CARE_PROVIDER_SITE_OTHER): Payer: Medicaid Other | Admitting: Obstetrics & Gynecology

## 2017-02-15 VITALS — BP 120/69 | HR 90 | Wt 281.0 lb

## 2017-02-15 DIAGNOSIS — Z3480 Encounter for supervision of other normal pregnancy, unspecified trimester: Secondary | ICD-10-CM

## 2017-02-15 DIAGNOSIS — Z3482 Encounter for supervision of other normal pregnancy, second trimester: Secondary | ICD-10-CM

## 2017-02-15 DIAGNOSIS — E669 Obesity, unspecified: Secondary | ICD-10-CM

## 2017-02-15 DIAGNOSIS — O34219 Maternal care for unspecified type scar from previous cesarean delivery: Secondary | ICD-10-CM

## 2017-02-15 DIAGNOSIS — Z98891 History of uterine scar from previous surgery: Secondary | ICD-10-CM

## 2017-02-15 DIAGNOSIS — Z8481 Family history of carrier of genetic disease: Secondary | ICD-10-CM

## 2017-02-15 DIAGNOSIS — O99212 Obesity complicating pregnancy, second trimester: Secondary | ICD-10-CM

## 2017-02-15 DIAGNOSIS — A6009 Herpesviral infection of other urogenital tract: Secondary | ICD-10-CM

## 2017-02-15 DIAGNOSIS — O99210 Obesity complicating pregnancy, unspecified trimester: Secondary | ICD-10-CM

## 2017-02-15 NOTE — Progress Notes (Signed)
   PRENATAL VISIT NOTE  Subjective:  Jean Fox is a 36 y.o. [id number] at 229w0d being seen today for ongoing prenatal care.  She is currently monitored for the following issues for this high-risk pregnancy and has Supervision of other normal pregnancy, antepartum; H/O cesarean section; Herpes genitalis in women; and Family history of carrier of hereditary disease on her problem list.  Patient reports no complaints.  Contractions: Not present. Vag. Bleeding: None.  Movement: Present. Denies leaking of fluid.   The following portions of the patient's history were reviewed and updated as appropriate: allergies, current medications, past family history, past medical history, past social history, past surgical history and problem list. Problem list updated.  Objective:   Vitals:   02/15/17 0955  BP: 120/69  Pulse: 90  Weight: 281 lb (127.5 kg)    Fetal Status: Fetal Heart Rate (bpm): 140   Movement: Present     General:  Alert, oriented and cooperative. Patient is in no acute distress.  Skin: Skin is warm and dry. No rash noted.   Cardiovascular: Normal heart rate noted  Respiratory: Normal respiratory effort, no problems with respiration noted  Abdomen: Soft, gravid, appropriate for gestational age. Pain/Pressure: Absent     Pelvic:  Cervical exam deferred        Extremities: Normal range of motion.  Edema: None  Mental Status: Normal mood and affect. Normal behavior. Normal judgment and thought content.   Assessment and Plan:  Pregnancy: G5P3013 at 6329w0d  1. Supervision of other normal pregnancy, antepartum Undecided on mode of contraception. Wants another child but, if extensive scar tissue wants a BTL. I have explained to pt that scar tissue and the rec not to get pregnant again due to it can be very subjective.  I have offered that we can review her last op note and furhter discuss her mode of contraception after that . See below     28 week labs, including glucola,  next  visit   2. H/O cesarean section Need scheduled repeat at 39 weeks Pt reports extensive scar tissue. [facility]   Need to see MD provider prior to c/s  3. Herpes genitalis in women Need Valtrex or HSV suppression at 36 weeks  4. Family history of carrier of hereditary disease Mother is carrier neg  5. Obesity in pregnancy Needs US for growth q 4 weeks Has appt for later this week  Preterm labor symptoms and general obstetric precautions including but not limited to vaginal bleeding, contractions, leaking of fluid and fetal movement were reviewed in detail with the patient. Please refer to After Visit Summary for other counseling recommendations.  Return in about 3 weeks (around 03/08/2017).   Willodean Rosenthalarolyn Harraway-Smith, MD

## 2017-02-18 ENCOUNTER — Other Ambulatory Visit (HOSPITAL_COMMUNITY): Payer: Self-pay | Admitting: *Deleted

## 2017-02-18 ENCOUNTER — Ambulatory Visit (HOSPITAL_COMMUNITY)
Admission: RE | Admit: 2017-02-18 | Discharge: 2017-02-18 | Disposition: A | Discharge status: Home / Self Care | Payer: Medicaid Other | Source: Ambulatory Visit | Attending: Certified Nurse Midwife | Admitting: Certified Nurse Midwife

## 2017-02-18 ENCOUNTER — Encounter (HOSPITAL_COMMUNITY): Payer: Self-pay

## 2017-02-18 DIAGNOSIS — O99212 Obesity complicating pregnancy, second trimester: Secondary | ICD-10-CM | POA: Diagnosis not present

## 2017-02-18 DIAGNOSIS — Z362 Encounter for other antenatal screening follow-up: Secondary | ICD-10-CM | POA: Diagnosis present

## 2017-02-18 DIAGNOSIS — Z8489 Family history of other specified conditions: Secondary | ICD-10-CM | POA: Diagnosis not present

## 2017-02-18 DIAGNOSIS — Z3A25 25 weeks gestation of pregnancy: Secondary | ICD-10-CM | POA: Insufficient documentation

## 2017-02-18 DIAGNOSIS — Z8279 Family history of other congenital malformations, deformations and chromosomal abnormalities: Secondary | ICD-10-CM | POA: Insufficient documentation

## 2017-02-18 DIAGNOSIS — Z0489 Encounter for examination and observation for other specified reasons: Secondary | ICD-10-CM

## 2017-02-18 DIAGNOSIS — IMO0002 Reserved for concepts with insufficient information to code with codable children: Secondary | ICD-10-CM

## 2017-02-18 DIAGNOSIS — O99210 Obesity complicating pregnancy, unspecified trimester: Secondary | ICD-10-CM

## 2017-03-08 ENCOUNTER — Encounter: Payer: Medicaid Other | Admitting: Obstetrics & Gynecology

## 2017-03-11 ENCOUNTER — Other Ambulatory Visit: Payer: Medicaid Other

## 2017-03-15 ENCOUNTER — Encounter: Payer: Medicaid Other | Admitting: Obstetrics & Gynecology

## 2017-03-15 ENCOUNTER — Other Ambulatory Visit: Payer: Medicaid Other

## 2017-03-18 ENCOUNTER — Ambulatory Visit (HOSPITAL_COMMUNITY)
Admission: RE | Admit: 2017-03-18 | Discharge: 2017-03-18 | Disposition: A | Discharge status: Home / Self Care | Payer: Medicaid Other | Source: Ambulatory Visit | Attending: Certified Nurse Midwife | Admitting: Certified Nurse Midwife

## 2017-03-18 ENCOUNTER — Other Ambulatory Visit (HOSPITAL_COMMUNITY): Payer: Self-pay | Admitting: Obstetrics and Gynecology

## 2017-03-18 ENCOUNTER — Encounter (HOSPITAL_COMMUNITY): Payer: Self-pay

## 2017-03-18 ENCOUNTER — Other Ambulatory Visit (HOSPITAL_COMMUNITY): Payer: Self-pay | Admitting: *Deleted

## 2017-03-18 DIAGNOSIS — Z8279 Family history of other congenital malformations, deformations and chromosomal abnormalities: Secondary | ICD-10-CM | POA: Insufficient documentation

## 2017-03-18 DIAGNOSIS — O99213 Obesity complicating pregnancy, third trimester: Secondary | ICD-10-CM | POA: Diagnosis not present

## 2017-03-18 DIAGNOSIS — Z8489 Family history of other specified conditions: Secondary | ICD-10-CM | POA: Insufficient documentation

## 2017-03-18 DIAGNOSIS — Z3A29 29 weeks gestation of pregnancy: Secondary | ICD-10-CM

## 2017-03-18 DIAGNOSIS — Z362 Encounter for other antenatal screening follow-up: Secondary | ICD-10-CM | POA: Diagnosis present

## 2017-03-18 DIAGNOSIS — O99210 Obesity complicating pregnancy, unspecified trimester: Secondary | ICD-10-CM

## 2017-03-18 DIAGNOSIS — F840 Autistic disorder: Secondary | ICD-10-CM | POA: Insufficient documentation

## 2017-03-18 DIAGNOSIS — Z8481 Family history of carrier of genetic disease: Secondary | ICD-10-CM

## 2017-03-18 DIAGNOSIS — O0990 Supervision of high risk pregnancy, unspecified, unspecified trimester: Secondary | ICD-10-CM

## 2017-03-25 ENCOUNTER — Other Ambulatory Visit: Payer: Medicaid Other

## 2017-03-29 ENCOUNTER — Other Ambulatory Visit: Payer: Medicaid Other

## 2017-03-29 ENCOUNTER — Encounter: Payer: Medicaid Other | Admitting: Obstetrics and Gynecology

## 2017-04-08 ENCOUNTER — Other Ambulatory Visit: Payer: Medicaid Other

## 2017-04-08 ENCOUNTER — Encounter: Payer: Self-pay | Admitting: Certified Nurse Midwife

## 2017-04-08 ENCOUNTER — Ambulatory Visit (INDEPENDENT_AMBULATORY_CARE_PROVIDER_SITE_OTHER): Payer: Medicaid Other | Admitting: Certified Nurse Midwife

## 2017-04-08 VITALS — BP 136/83 | HR 112 | Wt 285.0 lb

## 2017-04-08 DIAGNOSIS — Z98891 History of uterine scar from previous surgery: Secondary | ICD-10-CM

## 2017-04-08 DIAGNOSIS — O34219 Maternal care for unspecified type scar from previous cesarean delivery: Secondary | ICD-10-CM

## 2017-04-08 DIAGNOSIS — Z3483 Encounter for supervision of other normal pregnancy, third trimester: Secondary | ICD-10-CM

## 2017-04-08 DIAGNOSIS — O99213 Obesity complicating pregnancy, third trimester: Secondary | ICD-10-CM

## 2017-04-08 DIAGNOSIS — O99210 Obesity complicating pregnancy, unspecified trimester: Secondary | ICD-10-CM

## 2017-04-08 DIAGNOSIS — E669 Obesity, unspecified: Secondary | ICD-10-CM

## 2017-04-08 DIAGNOSIS — Z23 Encounter for immunization: Secondary | ICD-10-CM | POA: Diagnosis not present

## 2017-04-08 DIAGNOSIS — O2603 Excessive weight gain in pregnancy, third trimester: Secondary | ICD-10-CM

## 2017-04-08 DIAGNOSIS — Z3480 Encounter for supervision of other normal pregnancy, unspecified trimester: Secondary | ICD-10-CM

## 2017-04-08 NOTE — Progress Notes (Signed)
TDAP given 04/08/17. Patient tolerated well.

## 2017-04-08 NOTE — Progress Notes (Signed)
   PRENATAL VISIT NOTE  Subjective:  Jean Fox is a 36 y.o. Z6X0960 at [redacted]w[redacted]d being seen today for ongoing prenatal care.  She is currently monitored for the following issues for this low-risk pregnancy and has Supervision of other normal pregnancy, antepartum; H/O cesarean section; Herpes genitalis in women; Family history of carrier of hereditary disease; and Obesity affecting pregnancy, antepartum on her problem list.  Patient reports backache, no bleeding, no contractions, no cramping and no leaking.  Contractions: Not present. Vag. Bleeding: None.  Movement: Present. Denies leaking of fluid.   The following portions of the patient's history were reviewed and updated as appropriate: allergies, current medications, past family history, past medical history, past social history, past surgical history and problem list. Problem list updated.  Objective:   Vitals:   04/08/17 0921  BP: 136/83  Pulse: (!) 112  Weight: 285 lb (129.3 kg)    Fetal Status: Fetal Heart Rate (bpm): 142 Fundal Height: 40 cm Movement: Present     General:  Alert, oriented and cooperative. Patient is in no acute distress.  Skin: Skin is warm and dry. No rash noted.   Cardiovascular: Normal heart rate noted  Respiratory: Normal respiratory effort, no problems with respiration noted  Abdomen: Soft, gravid, appropriate for gestational age. Pain/Pressure: Absent     Pelvic:  Cervical exam deferred        Extremities: Normal range of motion.  Edema: None  Mental Status: Normal mood and affect. Normal behavior. Normal judgment and thought content.   Assessment and Plan:  Pregnancy: A5W0981 at [redacted]w[redacted]d  1. Supervision of other normal pregnancy, antepartum     Doing well - Glucose Tolerance, 2 Hours w/1 Hour - CBC - HIV antibody (with reflex) - RPR  2. H/O cesarean section    Repeat C-section, this will be the 4th C-section  3. Obesity affecting pregnancy, antepartum     7 lb weight gain this  pregnancy    Preterm labor symptoms and general obstetric precautions including but not limited to vaginal bleeding, contractions, leaking of fluid and fetal movement were reviewed in detail with the patient. Please refer to After Visit Summary for other counseling recommendations.  Return in about 2 weeks (around 04/22/2017) for ROB repeat C-section.   Roe Coombs, CNM

## 2017-04-09 LAB — GLUCOSE TOLERANCE, 2 HOURS W/ 1HR
GLUCOSE, 1 HOUR: 105 mg/dL (ref 65–179)
GLUCOSE, 2 HOUR: 94 mg/dL (ref 65–152)
Glucose, Fasting: 73 mg/dL (ref 65–91)

## 2017-04-09 LAB — CBC
HEMOGLOBIN: 12.1 g/dL (ref 11.1–15.9)
Hematocrit: 36.1 % (ref 34.0–46.6)
MCH: 27.9 pg (ref 26.6–33.0)
MCHC: 33.5 g/dL (ref 31.5–35.7)
MCV: 83 fL (ref 79–97)
Platelets: 227 10*3/uL (ref 150–379)
RBC: 4.34 x10E6/uL (ref 3.77–5.28)
RDW: 14.8 % (ref 12.3–15.4)
WBC: 8.9 10*3/uL (ref 3.4–10.8)

## 2017-04-09 LAB — HIV ANTIBODY (ROUTINE TESTING W REFLEX): HIV SCREEN 4TH GENERATION: NONREACTIVE

## 2017-04-09 LAB — RPR: RPR: NONREACTIVE

## 2017-04-11 ENCOUNTER — Other Ambulatory Visit: Payer: Self-pay | Admitting: Certified Nurse Midwife

## 2017-04-11 ENCOUNTER — Encounter (HOSPITAL_COMMUNITY): Payer: Self-pay

## 2017-04-11 DIAGNOSIS — Z3480 Encounter for supervision of other normal pregnancy, unspecified trimester: Secondary | ICD-10-CM

## 2017-04-15 ENCOUNTER — Ambulatory Visit (HOSPITAL_COMMUNITY): Payer: Medicaid Other

## 2017-04-20 ENCOUNTER — Other Ambulatory Visit: Payer: Self-pay | Admitting: Family Medicine

## 2017-04-20 ENCOUNTER — Ambulatory Visit (INDEPENDENT_AMBULATORY_CARE_PROVIDER_SITE_OTHER): Payer: Medicaid Other | Admitting: Obstetrics and Gynecology

## 2017-04-20 VITALS — BP 138/89 | HR 95 | Wt 285.4 lb

## 2017-04-20 DIAGNOSIS — Z3483 Encounter for supervision of other normal pregnancy, third trimester: Secondary | ICD-10-CM

## 2017-04-20 DIAGNOSIS — Z98891 History of uterine scar from previous surgery: Secondary | ICD-10-CM

## 2017-04-20 DIAGNOSIS — A6009 Herpesviral infection of other urogenital tract: Secondary | ICD-10-CM

## 2017-04-20 DIAGNOSIS — Z3480 Encounter for supervision of other normal pregnancy, unspecified trimester: Secondary | ICD-10-CM

## 2017-04-20 DIAGNOSIS — O34219 Maternal care for unspecified type scar from previous cesarean delivery: Secondary | ICD-10-CM

## 2017-04-20 NOTE — Patient Instructions (Signed)
Third Trimester of Pregnancy The third trimester is from week 28 through week 40 (months 7 through 9). The third trimester is a time when the unborn baby (fetus) is growing rapidly. At the end of the ninth month, the fetus is about 20 inches in length and weighs 6-10 pounds. Body changes during your third trimester Your body will continue to go through many changes during pregnancy. The changes vary from woman to woman. During the third trimester:  Your weight will continue to increase. You can expect to gain 25-35 pounds (11-16 kg) by the end of the pregnancy.  You may begin to get stretch marks on your hips, abdomen, and breasts.  You may urinate more often because the fetus is moving lower into your pelvis and pressing on your bladder.  You may develop or continue to have heartburn. This is caused by increased hormones that slow down muscles in the digestive tract.  You may develop or continue to have constipation because increased hormones slow digestion and cause the muscles that push waste through your intestines to relax.  You may develop hemorrhoids. These are swollen veins (varicose veins) in the rectum that can itch or be painful.  You may develop swollen, bulging veins (varicose veins) in your legs.  You may have increased body aches in the pelvis, back, or thighs. This is due to weight gain and increased hormones that are relaxing your joints.  You may have changes in your hair. These can include thickening of your hair, rapid growth, and changes in texture. Some women also have hair loss during or after pregnancy, or hair that feels dry or thin. Your hair will most likely return to normal after your baby is born.  Your breasts will continue to grow and they will continue to become tender. A yellow fluid (colostrum) may leak from your breasts. This is the first milk you are producing for your baby.  Your belly button may stick out.  You may notice more swelling in your hands,  face, or ankles.  You may have increased tingling or numbness in your hands, arms, and legs. The skin on your belly may also feel numb.  You may feel short of breath because of your expanding uterus.  You may have more problems sleeping. This can be caused by the size of your belly, increased need to urinate, and an increase in your body's metabolism.  You may notice the fetus "dropping," or moving lower in your abdomen (lightening).  You may have increased vaginal discharge.  You may notice your joints feel loose and you may have pain around your pelvic bone.  What to expect at prenatal visits You will have prenatal exams every 2 weeks until week 36. Then you will have weekly prenatal exams. During a routine prenatal visit:  You will be weighed to make sure you and the baby are growing normally.  Your blood pressure will be taken.  Your abdomen will be measured to track your baby's growth.  The fetal heartbeat will be listened to.  Any test results from the previous visit will be discussed.  You may have a cervical check near your due date to see if your cervix has softened or thinned (effaced).  You will be tested for Group B streptococcus. This happens between 35 and 37 weeks.  Your health care provider may ask you:  What your birth plan is.  How you are feeling.  If you are feeling the baby move.  If you have had   any abnormal symptoms, such as leaking fluid, bleeding, severe headaches, or abdominal cramping.  If you are using any tobacco products, including cigarettes, chewing tobacco, and electronic cigarettes.  If you have any questions.  Other tests or screenings that may be performed during your third trimester include:  Blood tests that check for low iron levels (anemia).  Fetal testing to check the health, activity level, and growth of the fetus. Testing is done if you have certain medical conditions or if there are problems during the  pregnancy.  Nonstress test (NST). This test checks the health of your baby to make sure there are no signs of problems, such as the baby not getting enough oxygen. During this test, a belt is placed around your belly. The baby is made to move, and its heart rate is monitored during movement.  What is false labor? False labor is a condition in which you feel small, irregular tightenings of the muscles in the womb (contractions) that usually go away with rest, changing position, or drinking water. These are called Braxton Hicks contractions. Contractions may last for hours, days, or even weeks before true labor sets in. If contractions come at regular intervals, become more frequent, increase in intensity, or become painful, you should see your health care provider. What are the signs of labor?  Abdominal cramps.  Regular contractions that start at 10 minutes apart and become stronger and more frequent with time.  Contractions that start on the top of the uterus and spread down to the lower abdomen and back.  Increased pelvic pressure and dull back pain.  A watery or bloody mucus discharge that comes from the vagina.  Leaking of amniotic fluid. This is also known as your "water breaking." It could be a slow trickle or a gush. Let your health care provider know if it has a color or strange odor. If you have any of these signs, call your health care provider right away, even if it is before your due date. Follow these instructions at home: Medicines  Follow your health care provider's instructions regarding medicine use. Specific medicines may be either safe or unsafe to take during pregnancy.  Take a prenatal vitamin that contains at least 600 micrograms (mcg) of folic acid.  If you develop constipation, try taking a stool softener if your health care provider approves. Eating and drinking  Eat a balanced diet that includes fresh fruits and vegetables, whole grains, good sources of protein  such as meat, eggs, or tofu, and low-fat dairy. Your health care provider will help you determine the amount of weight gain that is right for you.  Avoid raw meat and uncooked cheese. These carry germs that can cause birth defects in the baby.  If you have low calcium intake from food, talk to your health care provider about whether you should take a daily calcium supplement.  Eat four or five small meals rather than three large meals a day.  Limit foods that are high in fat and processed sugars, such as fried and sweet foods.  To prevent constipation: ? Drink enough fluid to keep your urine clear or pale yellow. ? Eat foods that are high in fiber, such as fresh fruits and vegetables, whole grains, and beans. Activity  Exercise only as directed by your health care provider. Most women can continue their usual exercise routine during pregnancy. Try to exercise for 30 minutes at least 5 days a week. Stop exercising if you experience uterine contractions.  Avoid heavy   lifting.  Do not exercise in extreme heat or humidity, or at high altitudes.  Wear low-heel, comfortable shoes.  Practice good posture.  You may continue to have sex unless your health care provider tells you otherwise. Relieving pain and discomfort  Take frequent breaks and rest with your legs elevated if you have leg cramps or low back pain.  Take warm sitz baths to soothe any pain or discomfort caused by hemorrhoids. Use hemorrhoid cream if your health care provider approves.  Wear a good support bra to prevent discomfort from breast tenderness.  If you develop varicose veins: ? Wear support pantyhose or compression stockings as told by your healthcare provider. ? Elevate your feet for 15 minutes, 3-4 times a day. Prenatal care  Write down your questions. Take them to your prenatal visits.  Keep all your prenatal visits as told by your health care provider. This is important. Safety  Wear your seat belt at  all times when driving.  Make a list of emergency phone numbers, including numbers for family, friends, the hospital, and police and fire departments. General instructions  Avoid cat litter boxes and soil used by cats. These carry germs that can cause birth defects in the baby. If you have a cat, ask someone to clean the litter box for you.  Do not travel far distances unless it is absolutely necessary and only with the approval of your health care provider.  Do not use hot tubs, steam rooms, or saunas.  Do not drink alcohol.  Do not use any products that contain nicotine or tobacco, such as cigarettes and e-cigarettes. If you need help quitting, ask your health care provider.  Do not use any medicinal herbs or unprescribed drugs. These chemicals affect the formation and growth of the baby.  Do not douche or use tampons or scented sanitary pads.  Do not cross your legs for long periods of time.  To prepare for the arrival of your baby: ? Take prenatal classes to understand, practice, and ask questions about labor and delivery. ? Make a trial run to the hospital. ? Visit the hospital and tour the maternity area. ? Arrange for maternity or paternity leave through employers. ? Arrange for family and friends to take care of pets while you are in the hospital. ? Purchase a rear-facing car seat and make sure you know how to install it in your car. ? Pack your hospital bag. ? Prepare the baby's nursery. Make sure to remove all pillows and stuffed animals from the baby's crib to prevent suffocation.  Visit your dentist if you have not gone during your pregnancy. Use a soft toothbrush to brush your teeth and be gentle when you floss. Contact a health care provider if:  You are unsure if you are in labor or if your water has broken.  You become dizzy.  You have mild pelvic cramps, pelvic pressure, or nagging pain in your abdominal area.  You have lower back pain.  You have persistent  nausea, vomiting, or diarrhea.  You have an unusual or bad smelling vaginal discharge.  You have pain when you urinate. Get help right away if:  Your water breaks before 37 weeks.  You have regular contractions less than 5 minutes apart before 37 weeks.  You have a fever.  You are leaking fluid from your vagina.  You have spotting or bleeding from your vagina.  You have severe abdominal pain or cramping.  You have rapid weight loss or weight gain.    You have shortness of breath with chest pain.  You notice sudden or extreme swelling of your face, hands, ankles, feet, or legs.  Your baby makes fewer than 10 movements in 2 hours.  You have severe headaches that do not go away when you take medicine.  You have vision changes. Summary  The third trimester is from week 28 through week 40, months 7 through 9. The third trimester is a time when the unborn baby (fetus) is growing rapidly.  During the third trimester, your discomfort may increase as you and your baby continue to gain weight. You may have abdominal, leg, and back pain, sleeping problems, and an increased need to urinate.  During the third trimester your breasts will keep growing and they will continue to become tender. A yellow fluid (colostrum) may leak from your breasts. This is the first milk you are producing for your baby.  False labor is a condition in which you feel small, irregular tightenings of the muscles in the womb (contractions) that eventually go away. These are called Braxton Hicks contractions. Contractions may last for hours, days, or even weeks before true labor sets in.  Signs of labor can include: abdominal cramps; regular contractions that start at 10 minutes apart and become stronger and more frequent with time; watery or bloody mucus discharge that comes from the vagina; increased pelvic pressure and dull back pain; and leaking of amniotic fluid. This information is not intended to replace advice  given to you by your health care provider. Make sure you discuss any questions you have with your health care provider. Document Released: 11/30/2001 Document Revised: 05/13/2016 Document Reviewed: 02/06/2013 Elsevier Interactive Patient Education  2017 [employer]  

## 2017-04-20 NOTE — Progress Notes (Signed)
Subjective:  Jean Fox is a 36 y.o. [id number] at [redacted]w[redacted]d being seen today for ongoing prenatal care.  She is currently monitored for the following issues for this high-risk pregnancy and has Supervision of other normal pregnancy, antepartum; H/O cesarean section; Herpes genitalis in women; Family history of carrier of hereditary disease; and Obesity affecting pregnancy, antepartum on her problem list.  Patient reports no complaints.  Contractions: Not present. Vag. Bleeding: None.  Movement: Absent. Denies leaking of fluid.   The following portions of the patient's history were reviewed and updated as appropriate: allergies, current medications, past family history, past medical history, past social history, past surgical history and problem list. Problem list updated.  Objective:   Vitals:   04/20/17 1607  BP: 138/89  Pulse: 95  Weight: 285 lb 6.4 oz (129.5 kg)    Fetal Status: Fetal Heart Rate (bpm): 131   Movement: Absent     General:  Alert, oriented and cooperative. Patient is in no acute distress.  Skin: Skin is warm and dry. No rash noted.   Cardiovascular: Normal heart rate noted  Respiratory: Normal respiratory effort, no problems with respiration noted  Abdomen: Soft, gravid, appropriate for gestational age. Pain/Pressure: Absent     Pelvic:  Cervical exam deferred        Extremities: Normal range of motion.  Edema: None  Mental Status: Normal mood and affect. Normal behavior. Normal judgment and thought content.   Urinalysis:      Assessment and Plan:  Pregnancy: L2G4010 at [redacted]w[redacted]d  1. H/O cesarean section For repeat c section at 39 wks  2. Supervision of other normal pregnancy, antepartum GBS at next OB visit  3. Herpes genitalis in women Start Valtrex at next visit  Preterm labor symptoms and general obstetric precautions including but not limited to vaginal bleeding, contractions, leaking of fluid and fetal movement were reviewed in detail with the  patient. Please refer to After Visit Summary for other counseling recommendations.  Return in about 2 weeks (around 05/04/2017) for OB visit.   Hermina Staggers, MD

## 2017-04-22 ENCOUNTER — Other Ambulatory Visit (HOSPITAL_COMMUNITY): Payer: Self-pay | Admitting: Maternal and Fetal Medicine

## 2017-04-22 ENCOUNTER — Ambulatory Visit (HOSPITAL_COMMUNITY)
Admission: RE | Admit: 2017-04-22 | Discharge: 2017-04-22 | Disposition: A | Discharge status: Home / Self Care | Payer: Medicaid Other | Source: Ambulatory Visit | Attending: Certified Nurse Midwife | Admitting: Certified Nurse Midwife

## 2017-04-22 ENCOUNTER — Encounter (HOSPITAL_COMMUNITY): Payer: Self-pay

## 2017-04-22 DIAGNOSIS — O99213 Obesity complicating pregnancy, third trimester: Secondary | ICD-10-CM

## 2017-04-22 DIAGNOSIS — O0993 Supervision of high risk pregnancy, unspecified, third trimester: Secondary | ICD-10-CM | POA: Insufficient documentation

## 2017-04-22 DIAGNOSIS — Z8489 Family history of other specified conditions: Secondary | ICD-10-CM | POA: Diagnosis not present

## 2017-04-22 DIAGNOSIS — Z362 Encounter for other antenatal screening follow-up: Secondary | ICD-10-CM

## 2017-04-22 DIAGNOSIS — Z3A34 34 weeks gestation of pregnancy: Secondary | ICD-10-CM | POA: Insufficient documentation

## 2017-04-22 DIAGNOSIS — O0990 Supervision of high risk pregnancy, unspecified, unspecified trimester: Secondary | ICD-10-CM | POA: Diagnosis present

## 2017-04-22 DIAGNOSIS — Z8279 Family history of other congenital malformations, deformations and chromosomal abnormalities: Secondary | ICD-10-CM | POA: Insufficient documentation

## 2017-04-22 NOTE — Addendum Note (Signed)
Encounter addended by: Emeline DarlingKasie E Deray Dawes on: 04/22/2017 11:39 AM<BR>    Actions taken: Imaging Exam ended

## 2017-05-03 ENCOUNTER — Ambulatory Visit (INDEPENDENT_AMBULATORY_CARE_PROVIDER_SITE_OTHER): Payer: Medicaid Other | Admitting: Obstetrics & Gynecology

## 2017-05-03 ENCOUNTER — Other Ambulatory Visit (HOSPITAL_COMMUNITY)
Admission: RE | Admit: 2017-05-03 | Discharge: 2017-05-03 | Disposition: A | Discharge status: Home / Self Care | Payer: Medicaid Other | Source: Ambulatory Visit | Attending: Obstetrics & Gynecology | Admitting: Obstetrics & Gynecology

## 2017-05-03 VITALS — BP 117/79 | HR 94 | Wt 287.6 lb

## 2017-05-03 DIAGNOSIS — O98313 Other infections with a predominantly sexual mode of transmission complicating pregnancy, third trimester: Secondary | ICD-10-CM

## 2017-05-03 DIAGNOSIS — Z3483 Encounter for supervision of other normal pregnancy, third trimester: Secondary | ICD-10-CM | POA: Insufficient documentation

## 2017-05-03 DIAGNOSIS — O34219 Maternal care for unspecified type scar from previous cesarean delivery: Secondary | ICD-10-CM

## 2017-05-03 DIAGNOSIS — Z98891 History of uterine scar from previous surgery: Secondary | ICD-10-CM

## 2017-05-03 DIAGNOSIS — Z3480 Encounter for supervision of other normal pregnancy, unspecified trimester: Secondary | ICD-10-CM

## 2017-05-03 DIAGNOSIS — A6009 Herpesviral infection of other urogenital tract: Secondary | ICD-10-CM

## 2017-05-03 MED ORDER — VALACYCLOVIR HCL 1 G PO TABS: 1000.0000 mg | ORAL_TABLET | Freq: Every day | ORAL | 0 refills | Status: AC

## 2017-05-03 NOTE — Progress Notes (Signed)
Patient reports she is doing well 

## 2017-05-03 NOTE — Progress Notes (Signed)
   PRENATAL VISIT NOTE  Subjective:  Jean Fox is a 36 y.o. [id number] at 2077w0d being seen today for ongoing prenatal care.  She is currently monitored for the following issues for this high-risk pregnancy and has Supervision of other normal pregnancy, antepartum; H/O cesarean section; Herpes genitalis in women; Family history of carrier of hereditary disease; and Obesity affecting pregnancy, antepartum on her problem list.  Patient reports no complaints.  Contractions: Irregular. Vag. Bleeding: None.  Movement: Present. Denies leaking of fluid.   The following portions of the patient's history were reviewed and updated as appropriate: allergies, current medications, past family history, past medical history, past social history, past surgical history and problem list. Problem list updated.  Objective:   Vitals:   05/03/17 1131  BP: 117/79  Pulse: 94  Weight: 130.5 kg (287 lb 9.6 oz)    Fetal Status: Fetal Heart Rate (bpm): 135   Movement: Present     General:  Alert, oriented and cooperative. Patient is in no acute distress.  Skin: Skin is warm and dry. No rash noted.   Cardiovascular: Normal heart rate noted  Respiratory: Normal respiratory effort, no problems with respiration noted  Abdomen: Soft, gravid, appropriate for gestational age. Pain/Pressure: Absent     Pelvic:  Cervical exam deferred        Extremities: Normal range of motion.  Edema: None  Mental Status: Normal mood and affect. Normal behavior. Normal judgment and thought content.   Assessment and Plan:  Pregnancy: A5W0981G5P3013 at 1977w0d  1. Herpes genitalis in women Valtrex  2. H/O cesarean section Repeat 39 weeks  3. Supervision of other normal pregnancy, antepartum  - Strep Gp B NAA - GC[facility]Chlamydia probe [facility]  Preterm labor symptoms and general obstetric precautions including but not limited to vaginal bleeding, contractions, leaking of fluid and fetal movement were reviewed in detail  with the patient. Please refer to After Visit Summary for other counseling recommendations.  Return in about 1 week (around 05/10/2017).   Jean PhenixArnold, Jean G, MD

## 2017-05-03 NOTE — Patient Instructions (Signed)
Third Trimester of Pregnancy The third trimester is from week 28 through week 40 (months 7 through 9). The third trimester is a time when the unborn baby (fetus) is growing rapidly. At the end of the ninth month, the fetus is about 20 inches in length and weighs 6-10 pounds. Body changes during your third trimester Your body will continue to go through many changes during pregnancy. The changes vary from woman to woman. During the third trimester:  Your weight will continue to increase. You can expect to gain 25-35 pounds (11-16 kg) by the end of the pregnancy.  You may begin to get stretch marks on your hips, abdomen, and breasts.  You may urinate more often because the fetus is moving lower into your pelvis and pressing on your bladder.  You may develop or continue to have heartburn. This is caused by increased hormones that slow down muscles in the digestive tract.  You may develop or continue to have constipation because increased hormones slow digestion and cause the muscles that push waste through your intestines to relax.  You may develop hemorrhoids. These are swollen veins (varicose veins) in the rectum that can itch or be painful.  You may develop swollen, bulging veins (varicose veins) in your legs.  You may have increased body aches in the pelvis, back, or thighs. This is due to weight gain and increased hormones that are relaxing your joints.  You may have changes in your hair. These can include thickening of your hair, rapid growth, and changes in texture. Some women also have hair loss during or after pregnancy, or hair that feels dry or thin. Your hair will most likely return to normal after your baby is born.  Your breasts will continue to grow and they will continue to become tender. A yellow fluid (colostrum) may leak from your breasts. This is the first milk you are producing for your baby.  Your belly button may stick out.  You may notice more swelling in your hands,  face, or ankles.  You may have increased tingling or numbness in your hands, arms, and legs. The skin on your belly may also feel numb.  You may feel short of breath because of your expanding uterus.  You may have more problems sleeping. This can be caused by the size of your belly, increased need to urinate, and an increase in your body's metabolism.  You may notice the fetus "dropping," or moving lower in your abdomen (lightening).  You may have increased vaginal discharge.  You may notice your joints feel loose and you may have pain around your pelvic bone.  What to expect at prenatal visits You will have prenatal exams every 2 weeks until week 36. Then you will have weekly prenatal exams. During a routine prenatal visit:  You will be weighed to make sure you and the baby are growing normally.  Your blood pressure will be taken.  Your abdomen will be measured to track your baby's growth.  The fetal heartbeat will be listened to.  Any test results from the previous visit will be discussed.  You may have a cervical check near your due date to see if your cervix has softened or thinned (effaced).  You will be tested for Group B streptococcus. This happens between 35 and 37 weeks.  Your health care provider may ask you:  What your birth plan is.  How you are feeling.  If you are feeling the baby move.  If you have had   any abnormal symptoms, such as leaking fluid, bleeding, severe headaches, or abdominal cramping.  If you are using any tobacco products, including cigarettes, chewing tobacco, and electronic cigarettes.  If you have any questions.  Other tests or screenings that may be performed during your third trimester include:  Blood tests that check for low iron levels (anemia).  Fetal testing to check the health, activity level, and growth of the fetus. Testing is done if you have certain medical conditions or if there are problems during the  pregnancy.  Nonstress test (NST). This test checks the health of your baby to make sure there are no signs of problems, such as the baby not getting enough oxygen. During this test, a belt is placed around your belly. The baby is made to move, and its heart rate is monitored during movement.  What is false labor? False labor is a condition in which you feel small, irregular tightenings of the muscles in the womb (contractions) that usually go away with rest, changing position, or drinking water. These are called Braxton Hicks contractions. Contractions may last for hours, days, or even weeks before true labor sets in. If contractions come at regular intervals, become more frequent, increase in intensity, or become painful, you should see your health care provider. What are the signs of labor?  Abdominal cramps.  Regular contractions that start at 10 minutes apart and become stronger and more frequent with time.  Contractions that start on the top of the uterus and spread down to the lower abdomen and back.  Increased pelvic pressure and dull back pain.  A watery or bloody mucus discharge that comes from the vagina.  Leaking of amniotic fluid. This is also known as your "water breaking." It could be a slow trickle or a gush. Let your health care provider know if it has a color or strange odor. If you have any of these signs, call your health care provider right away, even if it is before your due date. Follow these instructions at home: Medicines  Follow your health care provider's instructions regarding medicine use. Specific medicines may be either safe or unsafe to take during pregnancy.  Take a prenatal vitamin that contains at least 600 micrograms (mcg) of folic acid.  If you develop constipation, try taking a stool softener if your health care provider approves. Eating and drinking  Eat a balanced diet that includes fresh fruits and vegetables, whole grains, good sources of protein  such as meat, eggs, or tofu, and low-fat dairy. Your health care provider will help you determine the amount of weight gain that is right for you.  Avoid raw meat and uncooked cheese. These carry germs that can cause birth defects in the baby.  If you have low calcium intake from food, talk to your health care provider about whether you should take a daily calcium supplement.  Eat four or five small meals rather than three large meals a day.  Limit foods that are high in fat and processed sugars, such as fried and sweet foods.  To prevent constipation: ? Drink enough fluid to keep your urine clear or pale yellow. ? Eat foods that are high in fiber, such as fresh fruits and vegetables, whole grains, and beans. Activity  Exercise only as directed by your health care provider. Most women can continue their usual exercise routine during pregnancy. Try to exercise for 30 minutes at least 5 days a week. Stop exercising if you experience uterine contractions.  Avoid heavy   lifting.  Do not exercise in extreme heat or humidity, or at high altitudes.  Wear low-heel, comfortable shoes.  Practice good posture.  You may continue to have sex unless your health care provider tells you otherwise. Relieving pain and discomfort  Take frequent breaks and rest with your legs elevated if you have leg cramps or low back pain.  Take warm sitz baths to soothe any pain or discomfort caused by hemorrhoids. Use hemorrhoid cream if your health care provider approves.  Wear a good support bra to prevent discomfort from breast tenderness.  If you develop varicose veins: ? Wear support pantyhose or compression stockings as told by your healthcare provider. ? Elevate your feet for 15 minutes, 3-4 times a day. Prenatal care  Write down your questions. Take them to your prenatal visits.  Keep all your prenatal visits as told by your health care provider. This is important. Safety  Wear your seat belt at  all times when driving.  Make a list of emergency phone numbers, including numbers for family, friends, the hospital, and police and fire departments. General instructions  Avoid cat litter boxes and soil used by cats. These carry germs that can cause birth defects in the baby. If you have a cat, ask someone to clean the litter box for you.  Do not travel far distances unless it is absolutely necessary and only with the approval of your health care provider.  Do not use hot tubs, steam rooms, or saunas.  Do not drink alcohol.  Do not use any products that contain nicotine or tobacco, such as cigarettes and e-cigarettes. If you need help quitting, ask your health care provider.  Do not use any medicinal herbs or unprescribed drugs. These chemicals affect the formation and growth of the baby.  Do not douche or use tampons or scented sanitary pads.  Do not cross your legs for long periods of time.  To prepare for the arrival of your baby: ? Take prenatal classes to understand, practice, and ask questions about labor and delivery. ? Make a trial run to the hospital. ? Visit the hospital and tour the maternity area. ? Arrange for maternity or paternity leave through employers. ? Arrange for family and friends to take care of pets while you are in the hospital. ? Purchase a rear-facing car seat and make sure you know how to install it in your car. ? Pack your hospital bag. ? Prepare the baby's nursery. Make sure to remove all pillows and stuffed animals from the baby's crib to prevent suffocation.  Visit your dentist if you have not gone during your pregnancy. Use a soft toothbrush to brush your teeth and be gentle when you floss. Contact a health care provider if:  You are unsure if you are in labor or if your water has broken.  You become dizzy.  You have mild pelvic cramps, pelvic pressure, or nagging pain in your abdominal area.  You have lower back pain.  You have persistent  nausea, vomiting, or diarrhea.  You have an unusual or bad smelling vaginal discharge.  You have pain when you urinate. Get help right away if:  Your water breaks before 37 weeks.  You have regular contractions less than 5 minutes apart before 37 weeks.  You have a fever.  You are leaking fluid from your vagina.  You have spotting or bleeding from your vagina.  You have severe abdominal pain or cramping.  You have rapid weight loss or weight gain.    You have shortness of breath with chest pain.  You notice sudden or extreme swelling of your face, hands, ankles, feet, or legs.  Your baby makes fewer than 10 movements in 2 hours.  You have severe headaches that do not go away when you take medicine.  You have vision changes. Summary  The third trimester is from week 28 through week 40, months 7 through 9. The third trimester is a time when the unborn baby (fetus) is growing rapidly.  During the third trimester, your discomfort may increase as you and your baby continue to gain weight. You may have abdominal, leg, and back pain, sleeping problems, and an increased need to urinate.  During the third trimester your breasts will keep growing and they will continue to become tender. A yellow fluid (colostrum) may leak from your breasts. This is the first milk you are producing for your baby.  False labor is a condition in which you feel small, irregular tightenings of the muscles in the womb (contractions) that eventually go away. These are called Braxton Hicks contractions. Contractions may last for hours, days, or even weeks before true labor sets in.  Signs of labor can include: abdominal cramps; regular contractions that start at 10 minutes apart and become stronger and more frequent with time; watery or bloody mucus discharge that comes from the vagina; increased pelvic pressure and dull back pain; and leaking of amniotic fluid. This information is not intended to replace advice  given to you by your health care provider. Make sure you discuss any questions you have with your health care provider. Document Released: 11/30/2001 Document Revised: 05/13/2016 Document Reviewed: 02/06/2013 Elsevier Interactive Patient Education  2017 [employer]  

## 2017-05-04 LAB — GC/CHLAMYDIA PROBE AMP (~~LOC~~) NOT AT ARMC
Chlamydia: NEGATIVE
Neisseria Gonorrhea: NEGATIVE

## 2017-05-05 LAB — STREP GP B NAA: Strep Gp B NAA: POSITIVE — AB

## 2017-05-09 ENCOUNTER — Ambulatory Visit (INDEPENDENT_AMBULATORY_CARE_PROVIDER_SITE_OTHER): Payer: Medicaid Other | Admitting: Obstetrics and Gynecology

## 2017-05-09 VITALS — BP 130/81 | HR 110 | Wt 286.6 lb

## 2017-05-09 DIAGNOSIS — Z98891 History of uterine scar from previous surgery: Secondary | ICD-10-CM

## 2017-05-09 DIAGNOSIS — Z3480 Encounter for supervision of other normal pregnancy, unspecified trimester: Secondary | ICD-10-CM

## 2017-05-09 DIAGNOSIS — Z3483 Encounter for supervision of other normal pregnancy, third trimester: Secondary | ICD-10-CM

## 2017-05-09 DIAGNOSIS — A6009 Herpesviral infection of other urogenital tract: Secondary | ICD-10-CM

## 2017-05-09 NOTE — Progress Notes (Signed)
Patient complains of left side pain, it hurts to walk.

## 2017-05-09 NOTE — Progress Notes (Signed)
Subjective:  Jean Fox is a 36 y.o. Z6X0960G5P3013 at 8682w6d being seen today for ongoing prenatal care.  She is currently monitored for the following issues for this low-risk pregnancy and has Supervision of other normal pregnancy, antepartum; H/O cesarean section; Herpes genitalis in women; Family history of carrier of hereditary disease; and Obesity affecting pregnancy, antepartum on her problem list.  Patient reports no complaints.  Contractions: Irregular. Vag. Bleeding: None.  Movement: Present. Denies leaking of fluid.   The following portions of the patient's history were reviewed and updated as appropriate: allergies, current medications, past family history, past medical history, past social history, past surgical history and problem list. Problem list updated.  Objective:   Vitals:   05/09/17 0838  BP: 130/81  Pulse: (!) 110  Weight: 286 lb 9.6 oz (130 kg)    Fetal Status:     Movement: Present     General:  Alert, oriented and cooperative. Patient is in no acute distress.  Skin: Skin is warm and dry. No rash noted.   Cardiovascular: Normal heart rate noted  Respiratory: Normal respiratory effort, no problems with respiration noted  Abdomen: Soft, gravid, appropriate for gestational age. Pain/Pressure: Present     Pelvic:  Cervical exam deferred        Extremities: Normal range of motion.  Edema: Trace  Mental Status: Normal mood and affect. Normal behavior. Normal judgment and thought content.   Urinalysis:      Assessment and Plan:  Pregnancy: A5W0981G5P3013 at 5982w6d  1. Supervision of other normal pregnancy, antepartum Stable  2. H/O cesarean section Repeat on 05/25/17  3. Herpes genitalis in women Continue with Valtrex  Term labor symptoms and general obstetric precautions including but not limited to vaginal bleeding, contractions, leaking of fluid and fetal movement were reviewed in detail with the patient. Please refer to After Visit Summary for other counseling  recommendations.  Return in about 1 week (around 05/16/2017) for OB visit.   Jean Fox, Annslee Tercero L, MD

## 2017-05-09 NOTE — Patient Instructions (Signed)
Third Trimester of Pregnancy The third trimester is from week 28 through week 40 (months 7 through 9). The third trimester is a time when the unborn baby (fetus) is growing rapidly. At the end of the ninth month, the fetus is about 20 inches in length and weighs 6-10 pounds. Body changes during your third trimester Your body will continue to go through many changes during pregnancy. The changes vary from woman to woman. During the third trimester:  Your weight will continue to increase. You can expect to gain 25-35 pounds (11-16 kg) by the end of the pregnancy.  You may begin to get stretch marks on your hips, abdomen, and breasts.  You may urinate more often because the fetus is moving lower into your pelvis and pressing on your bladder.  You may develop or continue to have heartburn. This is caused by increased hormones that slow down muscles in the digestive tract.  You may develop or continue to have constipation because increased hormones slow digestion and cause the muscles that push waste through your intestines to relax.  You may develop hemorrhoids. These are swollen veins (varicose veins) in the rectum that can itch or be painful.  You may develop swollen, bulging veins (varicose veins) in your legs.  You may have increased body aches in the pelvis, back, or thighs. This is due to weight gain and increased hormones that are relaxing your joints.  You may have changes in your hair. These can include thickening of your hair, rapid growth, and changes in texture. Some women also have hair loss during or after pregnancy, or hair that feels dry or thin. Your hair will most likely return to normal after your baby is born.  Your breasts will continue to grow and they will continue to become tender. A yellow fluid (colostrum) may leak from your breasts. This is the first milk you are producing for your baby.  Your belly button may stick out.  You may notice more swelling in your hands,  face, or ankles.  You may have increased tingling or numbness in your hands, arms, and legs. The skin on your belly may also feel numb.  You may feel short of breath because of your expanding uterus.  You may have more problems sleeping. This can be caused by the size of your belly, increased need to urinate, and an increase in your body's metabolism.  You may notice the fetus "dropping," or moving lower in your abdomen (lightening).  You may have increased vaginal discharge.  You may notice your joints feel loose and you may have pain around your pelvic bone.  What to expect at prenatal visits You will have prenatal exams every 2 weeks until week 36. Then you will have weekly prenatal exams. During a routine prenatal visit:  You will be weighed to make sure you and the baby are growing normally.  Your blood pressure will be taken.  Your abdomen will be measured to track your baby's growth.  The fetal heartbeat will be listened to.  Any test results from the previous visit will be discussed.  You may have a cervical check near your due date to see if your cervix has softened or thinned (effaced).  You will be tested for Group B streptococcus. This happens between 35 and 37 weeks.  Your health care provider may ask you:  What your birth plan is.  How you are feeling.  If you are feeling the baby move.  If you have had   any abnormal symptoms, such as leaking fluid, bleeding, severe headaches, or abdominal cramping.  If you are using any tobacco products, including cigarettes, chewing tobacco, and electronic cigarettes.  If you have any questions.  Other tests or screenings that may be performed during your third trimester include:  Blood tests that check for low iron levels (anemia).  Fetal testing to check the health, activity level, and growth of the fetus. Testing is done if you have certain medical conditions or if there are problems during the  pregnancy.  Nonstress test (NST). This test checks the health of your baby to make sure there are no signs of problems, such as the baby not getting enough oxygen. During this test, a belt is placed around your belly. The baby is made to move, and its heart rate is monitored during movement.  What is false labor? False labor is a condition in which you feel small, irregular tightenings of the muscles in the womb (contractions) that usually go away with rest, changing position, or drinking water. These are called Braxton Hicks contractions. Contractions may last for hours, days, or even weeks before true labor sets in. If contractions come at regular intervals, become more frequent, increase in intensity, or become painful, you should see your health care provider. What are the signs of labor?  Abdominal cramps.  Regular contractions that start at 10 minutes apart and become stronger and more frequent with time.  Contractions that start on the top of the uterus and spread down to the lower abdomen and back.  Increased pelvic pressure and dull back pain.  A watery or bloody mucus discharge that comes from the vagina.  Leaking of amniotic fluid. This is also known as your "water breaking." It could be a slow trickle or a gush. Let your health care provider know if it has a color or strange odor. If you have any of these signs, call your health care provider right away, even if it is before your due date. Follow these instructions at home: Medicines  Follow your health care provider's instructions regarding medicine use. Specific medicines may be either safe or unsafe to take during pregnancy.  Take a prenatal vitamin that contains at least 600 micrograms (mcg) of folic acid.  If you develop constipation, try taking a stool softener if your health care provider approves. Eating and drinking  Eat a balanced diet that includes fresh fruits and vegetables, whole grains, good sources of protein  such as meat, eggs, or tofu, and low-fat dairy. Your health care provider will help you determine the amount of weight gain that is right for you.  Avoid raw meat and uncooked cheese. These carry germs that can cause birth defects in the baby.  If you have low calcium intake from food, talk to your health care provider about whether you should take a daily calcium supplement.  Eat four or five small meals rather than three large meals a day.  Limit foods that are high in fat and processed sugars, such as fried and sweet foods.  To prevent constipation: ? Drink enough fluid to keep your urine clear or pale yellow. ? Eat foods that are high in fiber, such as fresh fruits and vegetables, whole grains, and beans. Activity  Exercise only as directed by your health care provider. Most women can continue their usual exercise routine during pregnancy. Try to exercise for 30 minutes at least 5 days a week. Stop exercising if you experience uterine contractions.  Avoid heavy   lifting.  Do not exercise in extreme heat or humidity, or at high altitudes.  Wear low-heel, comfortable shoes.  Practice good posture.  You may continue to have sex unless your health care provider tells you otherwise. Relieving pain and discomfort  Take frequent breaks and rest with your legs elevated if you have leg cramps or low back pain.  Take warm sitz baths to soothe any pain or discomfort caused by hemorrhoids. Use hemorrhoid cream if your health care provider approves.  Wear a good support bra to prevent discomfort from breast tenderness.  If you develop varicose veins: ? Wear support pantyhose or compression stockings as told by your healthcare provider. ? Elevate your feet for 15 minutes, 3-4 times a day. Prenatal care  Write down your questions. Take them to your prenatal visits.  Keep all your prenatal visits as told by your health care provider. This is important. Safety  Wear your seat belt at  all times when driving.  Make a list of emergency phone numbers, including numbers for family, friends, the hospital, and police and fire departments. General instructions  Avoid cat litter boxes and soil used by cats. These carry germs that can cause birth defects in the baby. If you have a cat, ask someone to clean the litter box for you.  Do not travel far distances unless it is absolutely necessary and only with the approval of your health care provider.  Do not use hot tubs, steam rooms, or saunas.  Do not drink alcohol.  Do not use any products that contain nicotine or tobacco, such as cigarettes and e-cigarettes. If you need help quitting, ask your health care provider.  Do not use any medicinal herbs or unprescribed drugs. These chemicals affect the formation and growth of the baby.  Do not douche or use tampons or scented sanitary pads.  Do not cross your legs for long periods of time.  To prepare for the arrival of your baby: ? Take prenatal classes to understand, practice, and ask questions about labor and delivery. ? Make a trial run to the hospital. ? Visit the hospital and tour the maternity area. ? Arrange for maternity or paternity leave through employers. ? Arrange for family and friends to take care of pets while you are in the hospital. ? Purchase a rear-facing car seat and make sure you know how to install it in your car. ? Pack your hospital bag. ? Prepare the baby's nursery. Make sure to remove all pillows and stuffed animals from the baby's crib to prevent suffocation.  Visit your dentist if you have not gone during your pregnancy. Use a soft toothbrush to brush your teeth and be gentle when you floss. Contact a health care provider if:  You are unsure if you are in labor or if your water has broken.  You become dizzy.  You have mild pelvic cramps, pelvic pressure, or nagging pain in your abdominal area.  You have lower back pain.  You have persistent  nausea, vomiting, or diarrhea.  You have an unusual or bad smelling vaginal discharge.  You have pain when you urinate. Get help right away if:  Your water breaks before 37 weeks.  You have regular contractions less than 5 minutes apart before 37 weeks.  You have a fever.  You are leaking fluid from your vagina.  You have spotting or bleeding from your vagina.  You have severe abdominal pain or cramping.  You have rapid weight loss or weight gain.    You have shortness of breath with chest pain.  You notice sudden or extreme swelling of your face, hands, ankles, feet, or legs.  Your baby makes fewer than 10 movements in 2 hours.  You have severe headaches that do not go away when you take medicine.  You have vision changes. Summary  The third trimester is from week 28 through week 40, months 7 through 9. The third trimester is a time when the unborn baby (fetus) is growing rapidly.  During the third trimester, your discomfort may increase as you and your baby continue to gain weight. You may have abdominal, leg, and back pain, sleeping problems, and an increased need to urinate.  During the third trimester your breasts will keep growing and they will continue to become tender. A yellow fluid (colostrum) may leak from your breasts. This is the first milk you are producing for your baby.  False labor is a condition in which you feel small, irregular tightenings of the muscles in the womb (contractions) that eventually go away. These are called Braxton Hicks contractions. Contractions may last for hours, days, or even weeks before true labor sets in.  Signs of labor can include: abdominal cramps; regular contractions that start at 10 minutes apart and become stronger and more frequent with time; watery or bloody mucus discharge that comes from the vagina; increased pelvic pressure and dull back pain; and leaking of amniotic fluid. This information is not intended to replace advice  given to you by your health care provider. Make sure you discuss any questions you have with your health care provider. Document Released: 11/30/2001 Document Revised: 05/13/2016 Document Reviewed: 02/06/2013 Elsevier Interactive Patient Education  2017 [employer]  

## 2017-05-10 ENCOUNTER — Encounter: Payer: Medicaid Other | Admitting: Obstetrics & Gynecology

## 2017-05-18 ENCOUNTER — Telehealth (HOSPITAL_COMMUNITY): Payer: Self-pay | Admitting: *Deleted

## 2017-05-18 ENCOUNTER — Ambulatory Visit (INDEPENDENT_AMBULATORY_CARE_PROVIDER_SITE_OTHER): Payer: Medicaid Other | Admitting: Obstetrics & Gynecology

## 2017-05-18 VITALS — BP 129/87 | HR 101 | Wt 286.4 lb

## 2017-05-18 DIAGNOSIS — Z98891 History of uterine scar from previous surgery: Secondary | ICD-10-CM

## 2017-05-18 DIAGNOSIS — Z3483 Encounter for supervision of other normal pregnancy, third trimester: Secondary | ICD-10-CM

## 2017-05-18 DIAGNOSIS — O34219 Maternal care for unspecified type scar from previous cesarean delivery: Secondary | ICD-10-CM

## 2017-05-18 DIAGNOSIS — Z3480 Encounter for supervision of other normal pregnancy, unspecified trimester: Secondary | ICD-10-CM

## 2017-05-18 NOTE — Patient Instructions (Signed)
Cesarean Delivery °Cesarean birth, or cesarean delivery, is the surgical delivery of a baby through an incision in the abdomen and the uterus. This may be referred to as a C-section. This procedure may be scheduled ahead of time, or it may be done in an emergency situation. °Tell a health care provider about: °· Any allergies you have. °· All medicines you are taking, including vitamins, herbs, eye drops, creams, and over-the-counter medicines. °· Any problems you or family members have had with anesthetic medicines. °· Any blood disorders you have. °· Any surgeries you have had. °· Any medical conditions you have. °· Whether you or any members of your family have a history of deep vein thrombosis (DVT) or pulmonary embolism (PE). °What are the risks? °Generally, this is a safe procedure. However, problems may occur, including: °· Infection. °· Bleeding. °· Allergic reactions to medicines. °· Damage to other structures or organs. °· Blood clots. °· Injury to your baby. ° °What happens before the procedure? °· Follow instructions from your health care provider about eating or drinking restrictions. °· Follow instructions from your health care provider about bathing before your procedure to help reduce your risk of infection. °· If you know that you are going to have a cesarean delivery, do not shave your pubic area. Shaving before the procedure may increase your risk of infection. °· Ask your health care provider about: °? Changing or stopping your regular medicines. This is especially important if you are taking diabetes medicines or blood thinners. °? Your pain management plan. This is especially important if you plan to breastfeed your baby. °? How long you will be in the hospital after the procedure. °? Any concerns you may have about receiving blood products if you need them during the procedure. °? Cord blood banking, if you plan to collect your baby’s umbilical cord blood. °· You may also want to ask your  health care provider: °? Whether you will be able to hold or breastfeed your baby while you are still in the operating room. °? Whether your baby can stay with you immediately after the procedure and during your recovery. °? Whether a family member or a person of your choice can go with you into the operating room and stay with you during the procedure, immediately after the procedure, and during your recovery. °· Plan to have someone drive you home when you are discharged from the hospital. °What happens during the procedure? °· Fetal monitors will be placed on your abdomen to monitor your heart rate and your baby's heart rate. °· Depending on the reason for your cesarean delivery, you may have a physical exam or additional testing, such as an ultrasound. °· An IV tube will be inserted into one of your veins. °· You may have your blood or urine tested. °· You will be given antibiotic medicine to help prevent infection. °· You may be given a special warming gown to wear to keep your temperature stable. °· Hair may be removed from your pubic area. °· The skin of your pubic area and lower abdomen will be cleaned with a germ-killing solution (antiseptic). °· A catheter may be inserted into your bladder through your urethra. This drains your urine during the procedure. °· You may be given one or more of the following: °? A medicine to numb the area (local anesthetic). °? A medicine to make you fall asleep (general anesthetic). °? A medicine (regional anesthetic) that is injected into your back or through a small   thin tube placed in your back (spinal anesthetic or epidural anesthetic). This numbs everything below the injection site and allows you to stay awake during your procedure. If this makes you feel nauseous, tell your health care provider. Medicines will be available to help reduce any nausea you may feel. °· An incision will be made in your abdomen, and then in your uterus. °· If you are awake during your  procedure, you may feel tugging and pulling in your abdomen, but you should not feel pain. If you feel pain, tell your health care provider immediately. °· Your baby will be removed from your uterus. You may feel more pressure or pushing while this happens. °· Immediately after birth, your baby will be dried and kept warm. You may be able to hold and breastfeed your baby. The umbilical cord may be clamped and cut during this time. °· Your placenta will be removed from your uterus. °· Your incisions will be closed with stitches (sutures). Staples, skin glue, or adhesive strips may also be applied to the incision in your abdomen. °· Bandages (dressings) will be placed over the incision in your abdomen. °The procedure may vary among health care providers and hospitals. °What happens after the procedure? °· Your blood pressure, heart rate, breathing rate, and blood oxygen level will be monitored often until the medicines you were given have worn off. °· You may continue to receive fluids and medicines through an IV tube. °· You will have some pain. Medicines will be available to help control your pain. °· To help prevent blood clots: °? You may be given medicines. °? You may have to wear compression stockings or devices. °? You will be encouraged to walk around when you are able. °· Hospital staff will encourage and support bonding with your baby. Your hospital may allow you and your baby to stay in the same room (rooming in) during your hospital stay to encourage successful breastfeeding. °· You may be encouraged to cough and breathe deeply often. This helps to prevent lung problems. °· If you have a catheter draining your urine, it will be removed as soon as possible after your procedure. °This information is not intended to replace advice given to you by your health care provider. Make sure you discuss any questions you have with your health care provider. °Document Released: 12/06/2005 Document Revised: 05/13/2016  Document Reviewed: 09/16/2015 °Elsevier Interactive Patient Education © 2017 [employer] ° °

## 2017-05-18 NOTE — Progress Notes (Signed)
   PRENATAL VISIT NOTE  Subjective:  Jean Fox is a 36 y.o. [id number] at 2674w1d being seen today for ongoing prenatal care.  She is currently monitored for the following issues for this low-risk pregnancy and has Supervision of other normal pregnancy, antepartum; H/O cesarean section; Herpes genitalis in women; Family history of carrier of hereditary disease; and Obesity affecting pregnancy, antepartum on her problem list.  Patient reports no complaints.  Contractions: Irregular. Vag. Bleeding: None.  Movement: Present. Denies leaking of fluid.   The following portions of the patient's history were reviewed and updated as appropriate: allergies, current medications, past family history, past medical history, past social history, past surgical history and problem list. Problem list updated.  Objective:   Vitals:   05/18/17 1003  BP: 129/87  Pulse: (!) 101  Weight: 286 lb 6.4 oz (129.9 kg)    Fetal Status: Fetal Heart Rate (bpm): 148   Movement: Present     General:  Alert, oriented and cooperative. Patient is in no acute distress.  Skin: Skin is warm and dry. No rash noted.   Cardiovascular: Normal heart rate noted  Respiratory: Normal respiratory effort, no problems with respiration noted  Abdomen: Soft, gravid, appropriate for gestational age. Pain/Pressure: Present     Pelvic:  Cervical exam deferred        Extremities: Normal range of motion.  Edema: None  Mental Status: Normal mood and affect. Normal behavior. Normal judgment and thought content.   Assessment and Plan:  Pregnancy: [id number] at 2974w1d  1. Supervision of other normal pregnancy, antepartum Delivery 39 weeks  2. H/O cesarean section Repeat scheduled 39.1  Term labor symptoms and general obstetric precautions including but not limited to vaginal bleeding, contractions, leaking of fluid and fetal movement were reviewed in detail with the patient. Please refer to After Visit Summary for other counseling  recommendations.  Return in about 6 weeks (around 06/29/2017), or postpartum.   Scheryl DarterJames Luann Aspinwall, MD

## 2017-05-18 NOTE — Telephone Encounter (Signed)
Preadmission screen  

## 2017-05-19 ENCOUNTER — Telehealth (HOSPITAL_COMMUNITY): Payer: Self-pay | Admitting: *Deleted

## 2017-05-19 NOTE — Telephone Encounter (Signed)
Preadmission screen  

## 2017-05-20 ENCOUNTER — Encounter (HOSPITAL_COMMUNITY): Payer: Self-pay

## 2017-05-24 ENCOUNTER — Encounter (HOSPITAL_COMMUNITY)
Admission: RE | Admit: 2017-05-24 | Discharge: 2017-05-24 | Disposition: A | Discharge status: Home / Self Care | Payer: Medicaid Other | Source: Ambulatory Visit | Attending: Family Medicine | Admitting: Family Medicine

## 2017-05-24 LAB — CBC
HEMATOCRIT: 37.9 % (ref 36.0–46.0)
HEMOGLOBIN: 12.5 g/dL (ref 12.0–15.0)
MCH: 27.7 pg (ref 26.0–34.0)
MCHC: 33 g/dL (ref 30.0–36.0)
MCV: 83.8 fL (ref 78.0–100.0)
Platelets: 194 10*3/uL (ref 150–400)
RBC: 4.52 MIL/uL (ref 3.87–5.11)
RDW: 15.5 % (ref 11.5–15.5)
WBC: 7.9 10*3/uL (ref 4.0–10.5)

## 2017-05-24 LAB — RAPID HIV SCREEN (HIV 1/2 AB+AG)
HIV 1/2 ANTIBODIES: NONREACTIVE
HIV-1 P24 ANTIGEN - HIV24: NONREACTIVE

## 2017-05-24 LAB — TYPE AND SCREEN
ABO/RH(D): A POS
Antibody Screen: NEGATIVE

## 2017-05-24 LAB — ABO/RH: ABO/RH(D): A POS

## 2017-05-24 NOTE — Patient Instructions (Signed)
[address] Vi Fahs  05/24/2017   Your procedure is scheduled on:  05/25/2017  [facility].  Pick up the phone at the desk and dial [phone].   Call this number if you have problems the morning of surgery: [phone]   Remember:   Do not eat food:After Midnight.  Do not drink clear liquids: After Midnight.  Take these medicines the morning of surgery with A SIP OF WATER: none   Do not wear jewelry, make-up or nail polish.  Do not wear lotions, powders, or perfumes. Do not wear deodorant.  Do not shave 48 hours prior to surgery.  Do not bring valuables to the hospital.  [facility] not   responsible for any belongings or valuables brought to the hospital.  Contacts, dentures or bridgework may not be worn into surgery.  Leave suitcase in the car. After surgery it may be brought to your room.  For patients admitted to the hospital, checkout time is 11:00 AM the day of              discharge.   Patients discharged the day of surgery will not be allowed to drive             home.  Name and phone number of your driver: na  Special Instructions:   N/A   Please read over the following fact sheets that you were given:   Surgical Site Infection Prevention

## 2017-05-25 ENCOUNTER — Inpatient Hospital Stay (HOSPITAL_COMMUNITY)
Admission: RE | Admit: 2017-05-25 | Discharge: 2017-05-27 | DRG: 765 | Disposition: A | Discharge status: Home / Self Care | Payer: Medicaid Other | Source: Ambulatory Visit | Attending: Family Medicine | Admitting: Family Medicine

## 2017-05-25 ENCOUNTER — Encounter (HOSPITAL_COMMUNITY): Payer: Self-pay | Admitting: *Deleted

## 2017-05-25 ENCOUNTER — Inpatient Hospital Stay (HOSPITAL_COMMUNITY): Payer: Medicaid Other | Admitting: Anesthesiology

## 2017-05-25 ENCOUNTER — Encounter (HOSPITAL_COMMUNITY)
Admission: RE | Disposition: A | Discharge status: Home / Self Care | Payer: Self-pay | Source: Ambulatory Visit | Attending: Family Medicine

## 2017-05-25 DIAGNOSIS — O99210 Obesity complicating pregnancy, unspecified trimester: Secondary | ICD-10-CM | POA: Diagnosis present

## 2017-05-25 DIAGNOSIS — O99214 Obesity complicating childbirth: Secondary | ICD-10-CM | POA: Diagnosis present

## 2017-05-25 DIAGNOSIS — O99824 Streptococcus B carrier state complicating childbirth: Secondary | ICD-10-CM | POA: Diagnosis present

## 2017-05-25 DIAGNOSIS — Z3493 Encounter for supervision of normal pregnancy, unspecified, third trimester: Secondary | ICD-10-CM | POA: Diagnosis present

## 2017-05-25 DIAGNOSIS — Z3A39 39 weeks gestation of pregnancy: Secondary | ICD-10-CM

## 2017-05-25 DIAGNOSIS — Z8481 Family history of carrier of genetic disease: Secondary | ICD-10-CM

## 2017-05-25 DIAGNOSIS — O34211 Maternal care for low transverse scar from previous cesarean delivery: Secondary | ICD-10-CM | POA: Diagnosis present

## 2017-05-25 DIAGNOSIS — Z98891 History of uterine scar from previous surgery: Secondary | ICD-10-CM

## 2017-05-25 DIAGNOSIS — Z6841 Body Mass Index (BMI) 40.0 and over, adult: Secondary | ICD-10-CM

## 2017-05-25 DIAGNOSIS — Z3480 Encounter for supervision of other normal pregnancy, unspecified trimester: Secondary | ICD-10-CM

## 2017-05-25 LAB — RPR: RPR: NONREACTIVE

## 2017-05-25 SURGERY — Surgical Case
Anesthesia: Spinal | Site: Abdomen | Wound class: Clean Contaminated

## 2017-05-25 MED ORDER — PHENYLEPHRINE HCL 10 MG/ML IJ SOLN
INTRAMUSCULAR | Status: DC | PRN
  Administered 2017-05-25: 100 ug via INTRAVENOUS

## 2017-05-25 MED ORDER — SENNOSIDES-DOCUSATE SODIUM 8.6-50 MG PO TABS
2.0000 | ORAL_TABLET | ORAL | Status: DC
  Administered 2017-05-25 – 2017-05-26 (×2): 2 via ORAL
  Filled 2017-05-25 (×2): qty 2

## 2017-05-25 MED ORDER — NALOXONE HCL 2 MG/2ML IJ SOSY
1.0000 ug/kg/h | PREFILLED_SYRINGE | INTRAVENOUS | Status: DC | PRN
  Filled 2017-05-25: qty 2

## 2017-05-25 MED ORDER — BUPIVACAINE HCL 0.25 % IJ SOLN
INTRAMUSCULAR | Status: DC | PRN
  Administered 2017-05-25: 30 mL

## 2017-05-25 MED ORDER — ONDANSETRON HCL 4 MG/2ML IJ SOLN
INTRAMUSCULAR | Status: AC
  Filled 2017-05-25: qty 2

## 2017-05-25 MED ORDER — COCONUT OIL OIL: 1.0000 "application " | TOPICAL_OIL | Status: DC | PRN

## 2017-05-25 MED ORDER — SODIUM CHLORIDE 0.9% FLUSH
3.0000 mL | INTRAVENOUS | Status: DC | PRN
  Administered 2017-05-26: 3 mL via INTRAVENOUS
  Filled 2017-05-25: qty 3

## 2017-05-25 MED ORDER — MENTHOL 3 MG MT LOZG: 1.0000 | LOZENGE | OROMUCOSAL | Status: DC | PRN

## 2017-05-25 MED ORDER — DIBUCAINE 1 % RE OINT: 1.0000 "application " | TOPICAL_OINTMENT | RECTAL | Status: DC | PRN

## 2017-05-25 MED ORDER — BUPIVACAINE IN DEXTROSE 0.75-8.25 % IT SOLN
INTRATHECAL | Status: DC | PRN
  Administered 2017-05-25: 1.4 mL via INTRATHECAL

## 2017-05-25 MED ORDER — OXYCODONE-ACETAMINOPHEN 5-325 MG PO TABS: 2.0000 | ORAL_TABLET | ORAL | Status: DC | PRN

## 2017-05-25 MED ORDER — DEXTROSE 5 % IV SOLN: 3.0000 g | INTRAVENOUS | Status: DC

## 2017-05-25 MED ORDER — NALBUPHINE HCL 10 MG/ML IJ SOLN: 5.0000 mg | Freq: Once | INTRAMUSCULAR | Status: DC | PRN

## 2017-05-25 MED ORDER — PHENYLEPHRINE 8 MG IN D5W 100 ML (0.08MG/ML) PREMIX OPTIME
INJECTION | INTRAVENOUS | Status: AC
  Filled 2017-05-25: qty 100

## 2017-05-25 MED ORDER — DIPHENHYDRAMINE HCL 50 MG/ML IJ SOLN: 12.5000 mg | INTRAMUSCULAR | Status: DC | PRN

## 2017-05-25 MED ORDER — DIPHENHYDRAMINE HCL 25 MG PO CAPS: 25.0000 mg | ORAL_CAPSULE | Freq: Four times a day (QID) | ORAL | Status: DC | PRN

## 2017-05-25 MED ORDER — WITCH HAZEL-GLYCERIN EX PADS: 1.0000 "application " | MEDICATED_PAD | CUTANEOUS | Status: DC | PRN

## 2017-05-25 MED ORDER — ACETAMINOPHEN 325 MG PO TABS
650.0000 mg | ORAL_TABLET | ORAL | Status: DC | PRN
  Administered 2017-05-27: 650 mg via ORAL
  Filled 2017-05-25: qty 2

## 2017-05-25 MED ORDER — MEPERIDINE HCL 25 MG/ML IJ SOLN: 6.2500 mg | INTRAMUSCULAR | Status: DC | PRN

## 2017-05-25 MED ORDER — ONDANSETRON HCL 4 MG/2ML IJ SOLN
INTRAMUSCULAR | Status: DC | PRN
  Administered 2017-05-25: 4 mg via INTRAVENOUS

## 2017-05-25 MED ORDER — NALBUPHINE HCL 10 MG/ML IJ SOLN: 5.0000 mg | INTRAMUSCULAR | Status: DC | PRN

## 2017-05-25 MED ORDER — ACETAMINOPHEN 500 MG PO TABS
1000.0000 mg | ORAL_TABLET | Freq: Four times a day (QID) | ORAL | Status: AC
  Administered 2017-05-25 – 2017-05-26 (×2): 1000 mg via ORAL
  Filled 2017-05-25 (×2): qty 2

## 2017-05-25 MED ORDER — PHENYLEPHRINE 40 MCG/ML (10ML) SYRINGE FOR IV PUSH (FOR BLOOD PRESSURE SUPPORT)
PREFILLED_SYRINGE | INTRAVENOUS | Status: AC
  Filled 2017-05-25: qty 10

## 2017-05-25 MED ORDER — OXYTOCIN 10 UNIT/ML IJ SOLN
INTRAMUSCULAR | Status: AC
  Filled 2017-05-25: qty 4

## 2017-05-25 MED ORDER — PRENATAL MULTIVITAMIN CH
1.0000 | ORAL_TABLET | Freq: Every day | ORAL | Status: DC
Start: 2017-05-26 — End: 2017-05-27
  Administered 2017-05-26: 1 via ORAL
  Filled 2017-05-25: qty 1

## 2017-05-25 MED ORDER — LACTATED RINGERS IV SOLN
INTRAVENOUS | Status: DC
  Administered 2017-05-25 (×2): via INTRAVENOUS

## 2017-05-25 MED ORDER — DEXTROSE 5 % IV SOLN
3.0000 g | INTRAVENOUS | Status: AC
  Administered 2017-05-25: 3 g via INTRAVENOUS
  Filled 2017-05-25: qty 3

## 2017-05-25 MED ORDER — ZOLPIDEM TARTRATE 5 MG PO TABS: 5.0000 mg | ORAL_TABLET | Freq: Every evening | ORAL | Status: DC | PRN

## 2017-05-25 MED ORDER — FENTANYL CITRATE (PF) 100 MCG/2ML IJ SOLN
INTRAMUSCULAR | Status: AC
  Filled 2017-05-25: qty 2

## 2017-05-25 MED ORDER — SCOPOLAMINE 1 MG/3DAYS TD PT72
MEDICATED_PATCH | TRANSDERMAL | Status: DC | PRN
  Administered 2017-05-25: 1 via TRANSDERMAL

## 2017-05-25 MED ORDER — DIPHENHYDRAMINE HCL 25 MG PO CAPS: 25.0000 mg | ORAL_CAPSULE | ORAL | Status: DC | PRN

## 2017-05-25 MED ORDER — SCOPOLAMINE 1 MG/3DAYS TD PT72
MEDICATED_PATCH | TRANSDERMAL | Status: AC
  Filled 2017-05-25: qty 1

## 2017-05-25 MED ORDER — SCOPOLAMINE 1 MG/3DAYS TD PT72
1.0000 | MEDICATED_PATCH | Freq: Once | TRANSDERMAL | Status: DC
  Filled 2017-05-25: qty 1

## 2017-05-25 MED ORDER — SIMETHICONE 80 MG PO CHEW
80.0000 mg | CHEWABLE_TABLET | Freq: Three times a day (TID) | ORAL | Status: DC
  Administered 2017-05-26 (×3): 80 mg via ORAL
  Filled 2017-05-25 (×3): qty 1

## 2017-05-25 MED ORDER — KETOROLAC TROMETHAMINE 30 MG/ML IJ SOLN
30.0000 mg | Freq: Four times a day (QID) | INTRAMUSCULAR | Status: DC | PRN
  Administered 2017-05-25: 30 mg via INTRAMUSCULAR

## 2017-05-25 MED ORDER — LACTATED RINGERS IV SOLN
INTRAVENOUS | Status: DC
  Administered 2017-05-25 – 2017-05-26 (×2): via INTRAVENOUS

## 2017-05-25 MED ORDER — KETOROLAC TROMETHAMINE 30 MG/ML IJ SOLN: 30.0000 mg | Freq: Once | INTRAMUSCULAR | Status: DC | PRN

## 2017-05-25 MED ORDER — FENTANYL CITRATE (PF) 100 MCG/2ML IJ SOLN
INTRAMUSCULAR | Status: DC | PRN
  Administered 2017-05-25: 10 ug via INTRATHECAL

## 2017-05-25 MED ORDER — ONDANSETRON HCL 4 MG/2ML IJ SOLN
4.0000 mg | Freq: Three times a day (TID) | INTRAMUSCULAR | Status: DC | PRN
  Administered 2017-05-25: 4 mg via INTRAVENOUS
  Filled 2017-05-25: qty 2

## 2017-05-25 MED ORDER — PROMETHAZINE HCL 25 MG/ML IJ SOLN: 6.2500 mg | INTRAMUSCULAR | Status: DC | PRN

## 2017-05-25 MED ORDER — KETOROLAC TROMETHAMINE 30 MG/ML IJ SOLN: 30.0000 mg | Freq: Four times a day (QID) | INTRAMUSCULAR | Status: DC | PRN

## 2017-05-25 MED ORDER — BUPIVACAINE IN DEXTROSE 0.75-8.25 % IT SOLN
INTRATHECAL | Status: AC
  Filled 2017-05-25: qty 2

## 2017-05-25 MED ORDER — PHENYLEPHRINE 8 MG IN D5W 100 ML (0.08MG/ML) PREMIX OPTIME
INJECTION | INTRAVENOUS | Status: DC | PRN
  Administered 2017-05-25: 60 ug/min via INTRAVENOUS

## 2017-05-25 MED ORDER — SIMETHICONE 80 MG PO CHEW
80.0000 mg | CHEWABLE_TABLET | ORAL | Status: DC
  Administered 2017-05-25 – 2017-05-26 (×2): 80 mg via ORAL
  Filled 2017-05-25 (×2): qty 1

## 2017-05-25 MED ORDER — IBUPROFEN 600 MG PO TABS
600.0000 mg | ORAL_TABLET | Freq: Four times a day (QID) | ORAL | Status: DC
  Administered 2017-05-25 – 2017-05-27 (×5): 600 mg via ORAL
  Filled 2017-05-25 (×5): qty 1

## 2017-05-25 MED ORDER — HYDROMORPHONE HCL 1 MG/ML IJ SOLN: 0.2500 mg | INTRAMUSCULAR | Status: DC | PRN

## 2017-05-25 MED ORDER — KETOROLAC TROMETHAMINE 30 MG/ML IJ SOLN
INTRAMUSCULAR | Status: AC
  Filled 2017-05-25: qty 1

## 2017-05-25 MED ORDER — MORPHINE SULFATE (PF) 0.5 MG/ML IJ SOLN
INTRAMUSCULAR | Status: DC | PRN
  Administered 2017-05-25: .2 mg via INTRATHECAL

## 2017-05-25 MED ORDER — BUPIVACAINE HCL (PF) 0.25 % IJ SOLN
INTRAMUSCULAR | Status: AC
  Filled 2017-05-25: qty 30

## 2017-05-25 MED ORDER — OXYTOCIN 10 UNIT/ML IJ SOLN
INTRAVENOUS | Status: DC | PRN
  Administered 2017-05-25: 40 [IU] via INTRAVENOUS

## 2017-05-25 MED ORDER — TETANUS-DIPHTH-ACELL PERTUSSIS 5-2.5-18.5 LF-MCG/0.5 IM SUSP: 0.5000 mL | Freq: Once | INTRAMUSCULAR | Status: DC

## 2017-05-25 MED ORDER — OXYTOCIN 40 UNITS IN LACTATED RINGERS INFUSION - SIMPLE MED: 2.5000 [IU]/h | INTRAVENOUS | Status: AC

## 2017-05-25 MED ORDER — DEXAMETHASONE SODIUM PHOSPHATE 10 MG/ML IJ SOLN
INTRAMUSCULAR | Status: DC | PRN
  Administered 2017-05-25: 10 mg via INTRAVENOUS

## 2017-05-25 MED ORDER — SOD CITRATE-CITRIC ACID 500-334 MG/5ML PO SOLN
30.0000 mL | ORAL | Status: AC
  Administered 2017-05-25: 30 mL via ORAL
  Filled 2017-05-25: qty 15

## 2017-05-25 MED ORDER — OXYCODONE-ACETAMINOPHEN 5-325 MG PO TABS: 1.0000 | ORAL_TABLET | ORAL | Status: DC | PRN

## 2017-05-25 MED ORDER — MORPHINE SULFATE (PF) 0.5 MG/ML IJ SOLN
INTRAMUSCULAR | Status: AC
  Filled 2017-05-25: qty 10

## 2017-05-25 MED ORDER — DEXAMETHASONE SODIUM PHOSPHATE 10 MG/ML IJ SOLN
INTRAMUSCULAR | Status: AC
  Filled 2017-05-25: qty 1

## 2017-05-25 MED ORDER — SIMETHICONE 80 MG PO CHEW: 80.0000 mg | CHEWABLE_TABLET | ORAL | Status: DC | PRN

## 2017-05-25 MED ORDER — IBUPROFEN 600 MG PO TABS: 600.0000 mg | ORAL_TABLET | Freq: Four times a day (QID) | ORAL | Status: DC | PRN

## 2017-05-25 MED ORDER — NALOXONE HCL 0.4 MG/ML IJ SOLN: 0.4000 mg | INTRAMUSCULAR | Status: DC | PRN

## 2017-05-25 SURGICAL SUPPLY — 33 items
BENZOIN TINCTURE PRP APPL 2/3 (GAUZE/BANDAGES/DRESSINGS) ×3 IMPLANT
CHLORAPREP W/TINT 26ML (MISCELLANEOUS) ×3 IMPLANT
CLOSURE WOUND 1/2 X4 (GAUZE/BANDAGES/DRESSINGS) ×1
CLOTH BEACON ORANGE TIMEOUT ST (SAFETY) ×3 IMPLANT
DRESSING DISP NPWT PICO 4X12 (MISCELLANEOUS) ×3 IMPLANT
DRSG OPSITE POSTOP 4X10 (GAUZE/BANDAGES/DRESSINGS) ×3 IMPLANT
ELECT REM PT RETURN 9FT ADLT (ELECTROSURGICAL) ×3
ELECTRODE REM PT RTRN 9FT ADLT (ELECTROSURGICAL) ×1 IMPLANT
GLOVE BIOGEL PI IND STRL 7.0 (GLOVE) ×3 IMPLANT
GLOVE BIOGEL PI INDICATOR 7.0 (GLOVE) ×6
GLOVE ECLIPSE 6.5 STRL STRAW (GLOVE) ×3 IMPLANT
GOWN STRL REUS W/ TWL LRG LVL3 (GOWN DISPOSABLE) ×2 IMPLANT
GOWN STRL REUS W/TWL LRG LVL3 (GOWN DISPOSABLE) ×4
HOVERMATT SINGLE USE (MISCELLANEOUS) ×3 IMPLANT
NEEDLE HYPO 22GX1.5 SAFETY (NEEDLE) ×3 IMPLANT
NS IRRIG 1000ML POUR BTL (IV SOLUTION) ×3 IMPLANT
PAD OB MATERNITY 4.3X12.25 (PERSONAL CARE ITEMS) ×3 IMPLANT
PAD PREP 24X48 CUFFED NSTRL (MISCELLANEOUS) ×3 IMPLANT
RETRACTOR TRAXI PANNICULUS (MISCELLANEOUS) ×1 IMPLANT
RETRACTOR WND ALEXIS 25 LRG (MISCELLANEOUS) IMPLANT
RTRCTR WOUND ALEXIS 25CM LRG (MISCELLANEOUS)
STRIP CLOSURE SKIN 1/2X4 (GAUZE/BANDAGES/DRESSINGS) ×2 IMPLANT
SUT PLAIN 2 0 XLH (SUTURE) ×6 IMPLANT
SUT VIC AB 0 CT1 36 (SUTURE) ×6 IMPLANT
SUT VIC AB 2-0 CT1 27 (SUTURE) ×2
SUT VIC AB 2-0 CT1 TAPERPNT 27 (SUTURE) ×1 IMPLANT
SUT VIC AB 2-0 SH 27 (SUTURE) ×2
SUT VIC AB 2-0 SH 27XBRD (SUTURE) ×1 IMPLANT
SUT VIC AB 4-0 KS 27 (SUTURE) ×3 IMPLANT
SYR CONTROL 10ML LL (SYRINGE) ×3 IMPLANT
TOWEL OR 17X24 6PK STRL BLUE (TOWEL DISPOSABLE) ×9 IMPLANT
TRAXI PANNICULUS RETRACTOR (MISCELLANEOUS) ×2
TRAY FOLEY CATH SILVER 16FR (SET/KITS/TRAYS/PACK) ×3 IMPLANT

## 2017-05-25 NOTE — Anesthesia Procedure Notes (Signed)
Spinal  Start time: 05/25/2017 11:38 AM End time: 05/25/2017 11:42 AM Staffing Anesthesiologist: Leilani AbleHATCHETT, Melodie Ashworth Performed: anesthesiologist  Preanesthetic Checklist Completed: patient identified, surgical consent, pre-op evaluation, timeout performed, IV checked, risks and benefits discussed and monitors and equipment checked Spinal Block Prep: site prepped and draped and DuraPrep Patient monitoring: heart rate, cardiac monitor, continuous pulse ox and blood pressure Approach: midline Location: L3-4 Needle Needle type: Pencan  Needle gauge: 24 G Needle insertion depth: 7 cm Assessment Sensory level: T4

## 2017-05-25 NOTE — Anesthesia Preprocedure Evaluation (Signed)
Anesthesia Evaluation  Patient identified by MRN, date of birth, ID band Patient awake    Reviewed: Allergy & Precautions, H&P , NPO status , Patient's Chart, lab work & pertinent test results  Airway Mallampati: III  TM Distance: >3 FB Neck ROM: full    Dental no notable dental hx.    Pulmonary neg pulmonary ROS,    Pulmonary exam normal breath sounds clear to auscultation       Cardiovascular negative cardio ROS Normal cardiovascular exam Rhythm:regular Rate:Normal     Neuro/Psych negative neurological ROS  negative psych ROS   GI/Hepatic negative GI ROS, Neg liver ROS,   Endo/Other  Morbid obesity  Renal/GU negative Renal ROS  negative genitourinary   Musculoskeletal   Abdominal (+) + obese,   Peds  Hematology negative hematology ROS (+)   Anesthesia Other Findings   Reproductive/Obstetrics (+) Pregnancy                             Anesthesia Physical Anesthesia Plan  ASA: III  Anesthesia Plan: Spinal   Post-op Pain Management:    Induction:   PONV Risk Score and Plan: 2 and Ondansetron, Dexamethasone and Treatment may vary due to age  Airway Management Planned:   Additional Equipment:   Intra-op Plan:   Post-operative Plan:   Informed Consent: I have reviewed the patients History and Physical, chart, labs and discussed the procedure including the risks, benefits and alternatives for the proposed anesthesia with the patient or authorized representative who has indicated his/her understanding and acceptance.     Plan Discussed with: CRNA and Surgeon  Anesthesia Plan Comments:         Anesthesia Quick Evaluation

## 2017-05-25 NOTE — Anesthesia Postprocedure Evaluation (Signed)
Anesthesia Post Note  Patient: Jean Fox  Procedure(s) Performed: Procedure(s) (LRB): REPEAT CESAREAN SECTION (N/A)     Patient location during evaluation: PACU Anesthesia Type: Spinal Level of consciousness: awake Pain management: pain level controlled Vital Signs Assessment: post-procedure vital signs reviewed and stable Respiratory status: spontaneous breathing Cardiovascular status: stable Postop Assessment: no headache, no backache, spinal receding, patient able to bend at knees and adequate PO intake Anesthetic complications: no    Last Vitals:  Vitals:   05/25/17 1430 05/25/17 1445  BP: 127/88 (!) 123/95  Pulse: 80 79  Resp: 14 19  Temp:      Last Pain:  Vitals:   05/25/17 1315  TempSrc: Oral   Pain Goal: Patients Stated Pain Goal: 2 (05/25/17 1027)               Brylan Seubert JR,JOHN Susann GivensFRANKLIN

## 2017-05-25 NOTE — Progress Notes (Signed)
Anesthesia notified of pt arrival.

## 2017-05-25 NOTE — Op Note (Signed)
Elpidia Cortopassi PROCEDURE DATE: 05/25/2017  PREOPERATIVE DIAGNOSES: Intrauterine pregnancy at [redacted]w[redacted]d weeks gestation; Elective repeat  POSTOPERATIVE DIAGNOSES: The same  PROCEDURE: Repeat Low Transverse Cesarean Section  SURGEON:  Dr. Lyndel Safe- Primary           Dr. Nicholaus Bloom, Assistant    ASSISTANT:  Luiz Blare RNFA  INDICATIONS: Jean Fox is a 36 y.o. [id number] at [redacted]w[redacted]d here for cesarean section secondary to the indications listed under preoperative diagnoses; please see preoperative note for further details.  The risks of cesarean section were discussed with the patient including but were not limited to: bleeding which may require transfusion or reoperation; infection which may require antibiotics; injury to bowel, bladder, ureters or other surrounding organs; injury to the fetus; need for additional procedures including hysterectomy in the event of a life-threatening hemorrhage; placental abnormalities wth subsequent pregnancies, incisional problems, thromboembolic phenomenon and other postoperative/anesthesia complications.   The patient concurred with the proposed plan, giving informed written consent for the procedure.    FINDINGS:  Viable female infant in cephalic presentation.  Apgars 6, 7, and 8- taken to NICU due to poor transition.  Clear amniotic fluid.  Intact placenta, three vessel cord.  Normal uterus, fallopian tubes and ovaries bilaterally.  Bladder high on uterus, easily out of uterine incision area with use of Alexis. Mild scar tissue intraadominally.   ANESTHESIA: Spinal INTRAVENOUS FLUIDS: 3600 ml ESTIMATED BLOOD LOSS: 900 ml URINE OUTPUT:  80 ml- concentrated SPECIMENS: Placenta sent to L&D COMPLICATIONS: None immediate  PROCEDURE IN DETAIL:  The patient preoperatively received intravenous antibiotics and had sequential compression devices applied to her lower extremities.  She was then taken to the operating room where spinal anesthesia was administered  and was found to be adequate. She was then placed in a dorsal supine position with a leftward tilt, and prepped and draped in a sterile manner.  A foley catheter was placed into her bladder and attached to constant gravity.  After an adequate timeout was performed, a Pfannenstiel skin incision was made with scalpel and carried through to the underlying layer of fascia. The fascia was incised in the midline, and this incision was extended bilaterally using the Mayo scissors.  Kocher clamps were applied to the superior aspect of the fascial incision and the underlying rectus muscles were dissected off bluntly. A similar process was carried out on the inferior aspect of the fascial incision. The rectus muscles were separated using Maylard technique and the peritoneum was entered bluntly. Attention was turned to the lower uterine segment where a low transverse hysterotomy was made with a scalpel and extended with bandage scissors.  The infant was successfully delivered, the cord was clamped and cut and the infant was handed over to awaiting neonatology team. Uterine massage was then administered, and the placenta delivered intact with a three-vessel cord. The uterus was then cleared of clot and debris.  The hysterotomy was closed with 0 Vicryl in a running locked fashion, and an imbricating layer was also placed with 0 Vicryl.Figure-of-eight serosal stitches x 3 were placed to help with hemostasis.  The pelvis was cleared of all clot and debris. Hemostasis was confirmed on all surfaces.  The peritoneum and the muscles were reapproximated using 0 Vicryl interrupted stitches. The fascia was then closed using 0 Vicryl in a running fashion.  The subcutaneous layer was then reapproximated with 2-0 plain gut interrupted stitches, and 30 ml of 0.5% Marcaine was injected subcutaneously around the incision. Used bovie to control left lateral  SQ bleeding. The skin was closed with a 4-0 Vicryl subcuticular stitch. The patient  tolerated the procedure well. Sponge, lap, instrument and needle counts were correct x 2. PICO applied  She was taken to the recovery room in stable condition.   Federico FlakeKimberly Niles Shaneice Barsanti, MD, MPH, ABFM Attending Physician Faculty Practice- [facility]

## 2017-05-25 NOTE — Transfer of Care (Signed)
Immediate Anesthesia Transfer of Care Note  Patient: Jean Fox  Procedure(s) Performed: Procedure(s) with comments: REPEAT CESAREAN SECTION (N/A) - PICO DRESSING  Patient Location: PACU  Anesthesia Type:Spinal  Level of Consciousness: awake, alert  and oriented  Airway & Oxygen Therapy: Patient Spontanous Breathing  Post-op Assessment: Report given to RN and Post -op Vital signs reviewed and stable  Post vital signs: Reviewed and stable  Last Vitals:  Vitals:   05/25/17 1032  BP: 135/85  Pulse: (!) 102  Resp: 16  Temp: 37.3 C    Last Pain:  Vitals:   05/25/17 1032  TempSrc: Oral      Patients Stated Pain Goal: 2 (05/25/17 1027)  Complications: No apparent anesthesia complications

## 2017-05-25 NOTE — Lactation Note (Signed)
This note was copied from a baby's chart. Lactation Consultation Note  Patient Name: Jean Fox WGNFA'OToday's Date: 05/25/2017 Reason for consult: Initial assessment;Other (Comment) (infant in CN- Resp distress)   Initial assessment with mom of 1 hour old infant in PACU. Infant in CN due to respiratory distress. Mom wanting to pump asap to provide milk for infant. Mom BF her 3 older children for 15 months each. She reports she gave her 36 yo 1 bottle of formula/day due to jaundice for a few weeks as she did not have her pump at home yet.   Showed mom how to hand express and was able to obtain 7 ml colostrum easily between both breasts. Colostrum taken to CN, infant was resting on room air in dad's arms. He was cueing to feed. Dr. Weston BrassEttafagh gave verbal permission to allow infant to spoon feed.. LC and fob spoon fed infant. Infant tolerated it well with saturations 90-92 % throughout feeding.   Dr. Weston BrassEttafagh was updated on infant feeding and saturations and gave permission for infant to be returned to mom in PACU. Infant transported via crib to PACU on portable pulse ox accompanied by fob. Bracelets were matched and infant placed STS with mom. Infant laid on mom and fell asleep and pulse ox 98-100%. Infant not interested in latching at this time. Report to Dr. Weston BrassEttafagh and Vivi MartensAshley Billings, RN.   Enc mom to feed infant STS 8-12 x in 24 hours at first feeding cues. Mom reports she usually atttempts to feed her infants every 2 hours. Enc mom to use pillow for support with feedings. Discussed hand expressing prior to latch and after feeds to stimulate milk production. Mom reports her milk usually comes in on day 3. Mom with soft compressible breasts and areola with everted nipples. She reports + breast changes with pregnancy.   BF resources handout and LC Brochure given, mom informed of IP/OP services, BF Support Groups and LC phone #. Mom reports she has a Medela PIS at home for use. Mom without  questions/concerns at this time.    Maternal Data Formula Feeding for Exclusion: No Has patient been taught Hand Expression?: Yes Does the patient have breastfeeding experience prior to this delivery?: Yes  Feeding Feeding Type: Breast Milk  LATCH Score/Interventions                      Lactation Tools Discussed/Used WIC Program: No   Consult Status Consult Status: Follow-up Date: 05/26/17 Follow-up type: In-patient    [name] 05/25/2017, 2:23 PM

## 2017-05-25 NOTE — H&P (Signed)
Obstetric Preoperative History and Physical  Jean Fox is a 36 y.o. [id number] with IUP at [redacted]w[redacted]d presenting for presenting for scheduled cesarean section.  No acute concerns.   Prenatal Course Source of Care: [facility]  with onset of care at 12 weeks Pregnancy complications or risks: Patient Active Problem List   Diagnosis Date Noted  . Obesity affecting pregnancy, antepartum 02/15/2017  . Family history of carrier of hereditary disease 12/02/2016  . Supervision of other normal pregnancy, antepartum 11/16/2016  . H/O cesarean section 11/16/2016  . Herpes genitalis in women 11/16/2016   She plans to breastfeed, breastfed all children to 15 months She desires Unsure, considering Nexplanon. Did not sign tubal papers-- but wants to do "what is healthiest for her" and would consider BTL if scar tissue is extensive. Has has IUD x2 with explusionx2. Ha for postpartum contraception.   Prenatal labs and studies: ABO, Rh: --/--/A POS, A POS (06/05 1140) Antibody: NEG (06/05 1140) Rubella: 4.87 (11/28 1330) RPR: Non Reactive (06/05 1140)  HBsAg: Negative (11/28 1330)  HIV: Non Reactive (04/20 1040)  AVW:UJWJXBJY (05/15 1217) 1 hr Glucola  wnl Genetic screening normal Anatomy US normal  Prenatal Transfer Tool  Maternal Diabetes: No Genetic Screening: Normal Maternal Ultrasounds/Referrals: Normal Fetal Ultrasounds or other Referrals:  None Maternal Substance Abuse:  No Significant Maternal Medications:  None Significant Maternal Lab Results: Lab values include: Group B Strep positive  Past Medical History:  Diagnosis Date  . HSV (herpes simplex virus) anogenital infection     Past Surgical History:  Procedure Laterality Date  . CESAREAN SECTION      OB History  Gravida Para Term Preterm AB Living  5 3 3   1 3   SAB TAB Ectopic Multiple Live Births  1       3    # Outcome Date GA Lbr Len/2nd Weight Sex Delivery Anes PTL Lv  5 Current           4 Term 06/05/14 [redacted]w[redacted]d   M CS-LTranv    LIV  3 Term 11/30/09 [redacted]w[redacted]d   F CS-LTranv   LIV  2 Term 03/06/08 [redacted]w[redacted]d   M CS-LTranv   LIV  1 SAB               Social History   Social History  . Marital status: Married    Spouse name: N/A  . Number of children: N/A  . Years of education: N/A   Social History Main Topics  . Smoking status: Never Smoker  . Smokeless tobacco: Never Used  . Alcohol use No  . Drug use: No  . Sexual activity: Yes   Other Topics Concern  . None   Social History Narrative  . None    Family History  Problem Relation Age of Onset  . Hypertension Mother   . Hypertension Maternal Grandmother     Prescriptions Prior to Admission  Medication Sig Dispense Refill Last Dose  . Prenat w/o A-FeCbGl-DSS-FA-DHA (CITRANATAL 90 DHA) 90-1 & 300 MG MISC Take 1 tablet by mouth daily. 60 each 6 Taking    No Known Allergies  Review of Systems: Negative except for what is mentioned in HPI.  Physical Exam: BP 135/85   Pulse (!) 102   Temp 99.1 F (37.3 C) (Oral)   Resp 16   Ht 5\' 6"  (1.676 m)   Wt 286 lb (129.7 kg)   LMP 08/24/2016   BMI 46.16 kg/m  FHR by Doppler: 152 bpm CONSTITUTIONAL: Well-developed, well-nourished female  in no acute distress.  HENT:  Normocephalic, atraumatic, External right and left ear normal. Oropharynx is clear and moist EYES: Conjunctivae and EOM are normal. Pupils are equal, round, and reactive to light. No scleral icterus.  NECK: Normal range of motion, supple, no masses SKIN: Skin is warm and dry. No rash noted. Not diaphoretic. No erythema. No pallor. NEUROLGIC: Alert and oriented to person, place, and time. Normal reflexes, muscle tone coordination. No cranial nerve deficit noted. PSYCHIATRIC: Normal mood and affect. Normal behavior. Normal judgment and thought content. CARDIOVASCULAR: Normal heart rate noted, regular rhythm RESPIRATORY: Effort and breath sounds normal, no problems with respiration noted ABDOMEN: Soft, nontender, nondistended, gravid. Well-healed  Pfannenstiel incision. PELVIC: Deferred MUSCULOSKELETAL: Normal range of motion. No edema and no tenderness. 2+ distal pulses.   Pertinent Labs/Studies:   Results for orders placed or performed during the hospital encounter of 05/24/17 (from the past 72 hour(s))  CBC     Status: None   Collection Time: 05/24/17 11:40 AM  Result Value Ref Range   WBC 7.9 4.0 - 10.5 K/uL   RBC 4.52 3.87 - 5.11 MIL/uL   Hemoglobin 12.5 12.0 - 15.0 g/dL   HCT 16.137.9 09.636.0 - 04.546.0 %   MCV 83.8 78.0 - 100.0 fL   MCH 27.7 26.0 - 34.0 pg   MCHC 33.0 30.0 - 36.0 g/dL   RDW 40.915.5 81.111.5 - 91.415.5 %   Platelets 194 150 - 400 K/uL  Rapid HIV screen (HIV 1/2 Ab+Ag)     Status: None   Collection Time: 05/24/17 11:40 AM  Result Value Ref Range   HIV-1 P24 Antigen - HIV24 NON REACTIVE NON REACTIVE   HIV 1/2 Antibodies NON REACTIVE NON REACTIVE   Interpretation (HIV Ag Ab)      A non reactive test result means that HIV 1 or HIV 2 antibodies and HIV 1 p24 antigen were not detected in the specimen.  RPR     Status: None   Collection Time: 05/24/17 11:40 AM  Result Value Ref Range   RPR Ser Ql Non Reactive Non Reactive    Comment: (NOTE) Performed At: [facility] [address] [place], [place] [id number] Mila HomerHancock William F MD [phone]   Type and screen [facility]     Status: None   Collection Time: 05/24/17 11:40 AM  Result Value Ref Range   ABO/RH(D) A POS    Antibody Screen NEG    Sample Expiration 05/27/2017   ABO/Rh     Status: None   Collection Time: 05/24/17 11:40 AM  Result Value Ref Range   ABO/RH(D) A POS     Assessment and Plan :Jean Fox is a 36 y.o. [id number] at 7544w1d being admitted being admitted for scheduled cesarean section. The risks of cesarean section discussed with the patient included but were not limited to: bleeding which may require transfusion or reoperation; infection which may require antibiotics; injury to bowel, bladder, ureters or other surrounding  organs; injury to the fetus; need for additional procedures including hysterectomy in the event of a life-threatening hemorrhage; placental abnormalities wth subsequent pregnancies, incisional problems, thromboembolic phenomenon and other postoperative/anesthesia complications. The patient concurred with the proposed plan, giving informed written consent for the procedure. Patient has been NPO since last night she will remain NPO for procedure. Anesthesia and OR aware. Preoperative prophylactic antibiotics and SCDs ordered on call to the OR. To OR when ready.  Plan for PICO for operative dressing based on obesity.    Jean BradfordKimberly  Althia Forts, MD, MPH, ABFM Attending Physician Faculty Practice- [facility]

## 2017-05-26 ENCOUNTER — Encounter (HOSPITAL_COMMUNITY): Payer: Self-pay | Admitting: Family Medicine

## 2017-05-26 LAB — CBC
HCT: 31.4 % — ABNORMAL LOW (ref 36.0–46.0)
HEMOGLOBIN: 10.6 g/dL — AB (ref 12.0–15.0)
MCH: 28.3 pg (ref 26.0–34.0)
MCHC: 33.8 g/dL (ref 30.0–36.0)
MCV: 84 fL (ref 78.0–100.0)
PLATELETS: 172 10*3/uL (ref 150–400)
RBC: 3.74 MIL/uL — AB (ref 3.87–5.11)
RDW: 15.8 % — ABNORMAL HIGH (ref 11.5–15.5)
WBC: 13.6 10*3/uL — ABNORMAL HIGH (ref 4.0–10.5)

## 2017-05-26 LAB — BIRTH TISSUE RECOVERY COLLECTION (PLACENTA DONATION)

## 2017-05-26 NOTE — Anesthesia Postprocedure Evaluation (Signed)
Anesthesia Post Note  Patient: Jean Fox  Procedure(s) Performed: Procedure(s) (LRB): REPEAT CESAREAN SECTION (N/A)     Patient location during evaluation: Mother Baby Anesthesia Type: Spinal Level of consciousness: awake and alert and oriented Pain management: pain level controlled Vital Signs Assessment: post-procedure vital signs reviewed and stable Respiratory status: spontaneous breathing and nonlabored ventilation Cardiovascular status: stable Postop Assessment: no headache, patient able to bend at knees, no backache, no signs of nausea or vomiting, spinal receding and adequate PO intake Anesthetic complications: no    Last Vitals:  Vitals:   05/26/17 0237 05/26/17 0619  BP: 100/60 (!) 104/52  Pulse: 70 66  Resp: 18 18  Temp: 36.9 C 36.5 C    Last Pain:  Vitals:   05/26/17 0619  TempSrc: Oral   Pain Goal: Patients Stated Pain Goal: 2 (05/25/17 1027)               Yerlin Gasparyan Hristova

## 2017-05-26 NOTE — Addendum Note (Signed)
Addendum  created 05/26/17 0757 by Elgie CongoMalinova, Uilani Sanville H, CRNA   Sign clinical note

## 2017-05-26 NOTE — Progress Notes (Signed)
Subjective: Postpartum Day #1: Cesarean Delivery Patient reports tolerating PO.  Foley cath out this AM, has not voided yet.  Not passing gas yet.  Ambulation encouraged.  Breast feeding is going well.  Reports minimal incisional pain.   Objective: Vital signs in last 24 hours: Temp:  [97.6 F (36.4 C)-99.1 F (37.3 C)] 97.7 F (36.5 C) (06/07 0619) Pulse Rate:  [66-102] 66 (06/07 0619) Resp:  [14-20] 18 (06/07 0619) BP: (100-135)/(52-95) 104/52 (06/07 0619) SpO2:  [97 %-100 %] 99 % (06/07 0619) Weight:  [286 lb (129.7 kg)] 286 lb (129.7 kg) (06/06 1027)  Physical Exam:  General: alert, cooperative and no distress Lochia: appropriate Uterine Fundus: firm Incision: no significant drainage, no dehiscence, no significant erythema DVT Evaluation: No evidence of DVT seen on physical exam. No cords or calf tenderness. No significant calf/ankle edema. SCDs on.    Recent Labs  05/24/17 1140 05/26/17 0600  HGB 12.5 10.6*  HCT 37.9 31.4*    Assessment/Plan: Status post Cesarean section. Doing well postoperatively.  Continue current care.  Plan discharge home 05/27/17.  Nexplanon for contraception.    Roe Coombsachelle A Dennisha Mouser, CNM 05/26/2017, 8:14 AM

## 2017-05-26 NOTE — Progress Notes (Signed)
Post Partum Day 1  Subjective:  Jean Fox is a 36 y.o. [id number] 4962w1d s/p RLTCS.  No acute events overnight.  Pt denies problems with ambulating or PO intake. She has not voided yet (catheter removed 4 hours ago).  She denies any current nausea or vomiting.  Pain is well controlled.  She has not had flatus. She has not had bowel movement.  Lochia small.  Plan for birth control is currently undecided.  Method of Feeding: breast.   Objective: BP (!) 104/52 (BP Location: Right Arm)   Pulse 66   Temp 97.7 F (36.5 C) (Oral)   Resp 18   Ht 5\' 6"  (1.676 m)   Wt 129.7 kg (286 lb)   LMP 08/24/2016   SpO2 99%   Breastfeeding? Unknown   BMI 46.16 kg/m   Physical Exam:  General: alert, cooperative and no distress Lochia:normal flow Chest: CTAB Heart: RRR no m/r/g Abdomen: +BS, soft, nontender, fundus firm below umbilicus DVT Evaluation: No evidence of DVT seen on physical exam. Extremities: no edema noted  Recent Labs  05/24/17 1140 05/26/17 0600  HGB 12.5 10.6*  HCT 37.9 31.4*    Assessment/Plan:  ASSESSMENT: Jean Fox is a 36 y.o. [id number] 8062w1d ppd #1 s/p RLTCS doing well. Continue routine care; plan to discharge Saturday.    LOS: 1 day   Thurnell Loselessandra G Gehrig Patras 05/26/2017, 7:12 AM

## 2017-05-27 MED ORDER — IBUPROFEN 600 MG PO TABS
600.0000 mg | ORAL_TABLET | Freq: Four times a day (QID) | ORAL | 0 refills | Status: DC | PRN
Start: 2017-05-27 — End: 2019-06-13

## 2017-05-27 MED ORDER — TRAMADOL HCL 50 MG PO TABS: 100.0000 mg | ORAL_TABLET | Freq: Four times a day (QID) | ORAL | 0 refills | Status: AC | PRN

## 2017-05-27 NOTE — Lactation Note (Addendum)
This note was copied from a baby's chart. Lactation Consultation Note Mom's 4th child. BF all the other 3 children for 15 months. Mom stated the only issues she had BF was that when she went home she became engorged and her pump hadn't came yet. Thus why she asked for a hand pump. Mom wants early d/c home even though she is a c/section.  Mom states baby is BF great w/no issues. Mom states she hears swallows. Baby 42 hrs old, had cluster fed some during the night. Has had 8 voids and 6 stools in life.  Discussed engorgement, prevention, maintained, supply and demand. Cont.to document I&O until return to Dr. Alfonzo BeersAppt. For baby.  Educated newborn behavior and feeding habits. Mom has large pendulum breast w/short shaft nipples, very compressible. Has colostrum easily expressed.  Reviewed LC op services as well as resources.  Patient Name: Jean Fox ZOXWR'UToday's Date: 05/27/2017 Reason for consult: Follow-up assessment   Maternal Data    Feeding Feeding Type: Breast Fed Length of feed: 10 min  LATCH Score/Interventions       Type of Nipple: Everted at rest and after stimulation Intervention(s): Hand pump  Comfort (Breast/Nipple): Soft / non-tender     Hold (Positioning): No assistance needed to correctly position infant at breast. Intervention(s): Breastfeeding basics reviewed     Lactation Tools Discussed/Used Tools: Pump WIC Program: No Initiated by:: RN Date initiated:: 05/26/17   Consult Status Consult Status: Complete Date: 05/27/17    Charyl DancerCARVER, Jean Fox 05/27/2017, 6:31 AM

## 2017-05-27 NOTE — Discharge Summary (Signed)
OB Discharge Summary     Patient Name: Jean Fox DOB: 01-03-1981 MRN: [id number]  Date of admission: 05/25/2017 Delivering MD: Lyndel Safe NILES   Date of discharge: 05/27/2017  Admitting diagnosis: RCS Intrauterine pregnancy: [redacted]w[redacted]d     Secondary diagnosis:  Principal Problem:   S/P repeat low transverse C-section Active Problems:   Supervision of other normal pregnancy, antepartum   H/O cesarean section   Family history of carrier of hereditary disease   Obesity affecting pregnancy, antepartum  Additional problems: none     Discharge diagnosis: Term Pregnancy Delivered                                                                                                Post partum procedures:none  Augmentation: n/a  Complications: None  Hospital course:  Sceduled C/S   36 y.o. yo [id number] at [redacted]w[redacted]d was admitted to the hospital 05/25/2017 for scheduled cesarean section with the following indication:Elective Repeat.  Membrane Rupture Time/Date: 12:08 PM ,05/25/2017   Patient delivered a Viable infant.05/25/2017  Details of operation can be found in separate operative note.  Pateint had an uncomplicated postpartum course.  She is ambulating, tolerating a regular diet, passing flatus, and urinating well. Patient is discharged home in stable condition on  05/27/17         Physical exam  Vitals:   05/26/17 0619 05/26/17 0930 05/26/17 1854 05/27/17 0534  BP: (!) 104/52 103/62 (!) 121/59 (!) 116/59  Pulse: 66 76 68 72  Resp: 18 16 18 18   Temp: 97.7 F (36.5 C) 97.9 F (36.6 C) 98.3 F (36.8 C) 98.4 F (36.9 C)  TempSrc: Oral Oral Oral Oral  SpO2: 99% 99%    Weight:      Height:       General: alert, cooperative and no distress Lochia: appropriate Uterine Fundus: firm Incision: Healing well with no significant drainage, No significant erythema, Dressing is clean, dry, and intact DVT Evaluation: No evidence of DVT seen on physical exam. Negative Homan's sign. No cords or  calf tenderness. Labs: Lab Results  Component Value Date   WBC 13.6 (H) 05/26/2017   HGB 10.6 (L) 05/26/2017   HCT 31.4 (L) 05/26/2017   MCV 84.0 05/26/2017   PLT 172 05/26/2017   No flowsheet data found.  Discharge instruction: per After Visit Summary and "Baby and Me Booklet".  After visit meds:  Allergies as of 05/27/2017   No Known Allergies     Medication List    TAKE these medications   CITRANATAL 90 DHA 90-1 & 300 MG Misc Take 1 tablet by mouth daily.   ibuprofen 600 MG tablet Commonly known as:  ADVIL,MOTRIN Take 1 tablet (600 mg total) by mouth every 6 (six) hours as needed for mild pain.   traMADol 50 MG tablet Commonly known as:  ULTRAM Take 2 tablets (100 mg total) by mouth every 6 (six) hours as needed.     (PT did not want a "strong" narcotic d/t "feeling loopy")  Diet: routine diet  Activity: Advance as tolerated. Pelvic rest for 6 weeks.   Outpatient follow  up:4 weeks Follow up Appt:No future appointments. Follow up Visit:No Follow-up on file.  Postpartum contraception: nexplanon  Newborn Data: Live born female  Birth Weight: 8 lb 2.7 oz (3705 g) APGAR: 6, 7  Baby Feeding: Breast Disposition:home with mother   05/27/2017 Greig RightRESENZO-DISHMAN,Libby Goehring, CNM

## 2017-05-27 NOTE — Discharge Instructions (Signed)

## 2017-06-03 ENCOUNTER — Encounter (HOSPITAL_COMMUNITY): Payer: Self-pay | Admitting: *Deleted

## 2017-06-23 ENCOUNTER — Ambulatory Visit: Payer: Medicaid Other | Admitting: Obstetrics & Gynecology

## 2017-07-15 ENCOUNTER — Other Ambulatory Visit: Payer: Self-pay | Admitting: Certified Nurse Midwife

## 2017-07-15 DIAGNOSIS — Z3480 Encounter for supervision of other normal pregnancy, unspecified trimester: Secondary | ICD-10-CM

## 2019-03-05 ENCOUNTER — Other Ambulatory Visit: Payer: Self-pay

## 2019-03-05 ENCOUNTER — Ambulatory Visit (INDEPENDENT_AMBULATORY_CARE_PROVIDER_SITE_OTHER): Payer: Medicaid Other

## 2019-03-05 DIAGNOSIS — Z3201 Encounter for pregnancy test, result positive: Secondary | ICD-10-CM | POA: Diagnosis not present

## 2019-03-05 DIAGNOSIS — N912 Amenorrhea, unspecified: Secondary | ICD-10-CM

## 2019-03-05 LAB — POCT URINE PREGNANCY: Preg Test, Ur: POSITIVE — AB

## 2019-03-05 MED ORDER — CITRANATAL 90 DHA 90-1 & 300 MG PO MISC: 1.0000 | Freq: Every day | ORAL | 11 refills | Status: DC

## 2019-03-05 NOTE — Addendum Note (Signed)
Addended by: Dalphine Handing on: 03/05/2019 03:32 PM   Modules accepted: Orders

## 2019-03-05 NOTE — Progress Notes (Signed)
Jean Fox presents today for UPT. She has no unusual complaints. LMP: 01/08/19    OBJECTIVE: Appears well, in no apparent distress.  OB History    Gravida  6   Para  4   Term  4   Preterm      AB  1   Living  4     SAB  1   TAB      Ectopic      Multiple  0   Live Births  4          Home UPT Result: Positive In-Office UPT result: Positive  I have reviewed the patient's medical, obstetrical, social, and family histories, and medications.   ASSESSMENT: Positive pregnancy test  PLAN Prenatal care to be completed at:  [facility]  Citranatal 90 DHA sent to pharmacy per pt's request

## 2019-03-07 NOTE — Progress Notes (Signed)
I have reviewed the chart and agree with nursing staff's documentation of this patient's encounter.  Sol Odor, MD 03/07/2019 1:24 PM    

## 2019-03-21 DIAGNOSIS — Z3490 Encounter for supervision of normal pregnancy, unspecified, unspecified trimester: Secondary | ICD-10-CM | POA: Insufficient documentation

## 2019-03-22 ENCOUNTER — Encounter: Payer: Self-pay | Admitting: Obstetrics and Gynecology

## 2019-03-22 ENCOUNTER — Other Ambulatory Visit (HOSPITAL_COMMUNITY)
Admission: RE | Admit: 2019-03-22 | Discharge: 2019-03-22 | Disposition: A | Discharge status: Home / Self Care | Payer: Medicaid Other | Source: Ambulatory Visit | Attending: Obstetrics and Gynecology | Admitting: Obstetrics and Gynecology

## 2019-03-22 ENCOUNTER — Ambulatory Visit (INDEPENDENT_AMBULATORY_CARE_PROVIDER_SITE_OTHER): Payer: Medicaid Other | Admitting: Obstetrics and Gynecology

## 2019-03-22 ENCOUNTER — Other Ambulatory Visit: Payer: Self-pay

## 2019-03-22 VITALS — BP 121/71 | HR 84 | Temp 97.6°F | Wt 270.0 lb

## 2019-03-22 DIAGNOSIS — A6009 Herpesviral infection of other urogenital tract: Secondary | ICD-10-CM

## 2019-03-22 DIAGNOSIS — Z98891 History of uterine scar from previous surgery: Secondary | ICD-10-CM

## 2019-03-22 DIAGNOSIS — Z23 Encounter for immunization: Secondary | ICD-10-CM | POA: Diagnosis not present

## 2019-03-22 DIAGNOSIS — Z3A10 10 weeks gestation of pregnancy: Secondary | ICD-10-CM

## 2019-03-22 DIAGNOSIS — Z3481 Encounter for supervision of other normal pregnancy, first trimester: Secondary | ICD-10-CM

## 2019-03-22 DIAGNOSIS — O99210 Obesity complicating pregnancy, unspecified trimester: Secondary | ICD-10-CM

## 2019-03-22 DIAGNOSIS — Z3491 Encounter for supervision of normal pregnancy, unspecified, first trimester: Secondary | ICD-10-CM | POA: Diagnosis not present

## 2019-03-22 DIAGNOSIS — Z8481 Family history of carrier of genetic disease: Secondary | ICD-10-CM

## 2019-03-22 DIAGNOSIS — Z3490 Encounter for supervision of normal pregnancy, unspecified, unspecified trimester: Secondary | ICD-10-CM | POA: Diagnosis not present

## 2019-03-22 DIAGNOSIS — O99211 Obesity complicating pregnancy, first trimester: Secondary | ICD-10-CM

## 2019-03-22 MED ORDER — ASPIRIN EC 81 MG PO TBEC
81.0000 mg | DELAYED_RELEASE_TABLET | Freq: Every day | ORAL | 2 refills | Status: DC
Start: 2019-03-22 — End: 2019-10-10

## 2019-03-22 NOTE — Progress Notes (Signed)
INITIAL PRENATAL VISIT NOTE  Subjective:  Jean Fox is a 38 y.o. [id number] at [redacted]w[redacted]d by LMP being seen today for her initial prenatal visit. This is a planned pregnancy. She and partner are happy with the pregnancy. She was using nothing for birth control previously. She has an obstetric history significant for 4 x CS. She has a medical history significant for n/a.  Patient reports nausea.  Contractions: Not present. Vag. Bleeding: None.   . Denies leaking of fluid.   Past Medical History:  Diagnosis Date  . Asthma   . HSV (herpes simplex virus) anogenital infection     Past Surgical History:  Procedure Laterality Date  . CESAREAN SECTION    . CESAREAN SECTION N/A 05/25/2017   Procedure: REPEAT CESAREAN SECTION;  Surgeon: Federico Flake, MD;  Location: [facility] BIRTHING SUITES;  Service: Obstetrics;  Laterality: N/A;  PICO DRESSING    OB History  Gravida Para Term Preterm AB Living  SAB TAB Ectopic Multiple Live Births  1     0 4    # Outcome Date GA Lbr Len/2nd Weight Sex Delivery Anes PTL Lv  6 Current           5 Term 05/25/17 [redacted]w[redacted]d  8 lb 2.7 oz (3.705 kg) M CS-LTranv Spinal  LIV  4 Term 06/05/14 [redacted]w[redacted]d   M CS-LTranv   LIV  3 Term 11/30/09 [redacted]w[redacted]d   F CS-LTranv   LIV  2 Term 03/06/08 [redacted]w[redacted]d   M CS-LTranv   LIV  1 SAB             Social History   Socioeconomic History  . Marital status: Married    Spouse name: Not on file  . Number of children: Not on file  . Years of education: Not on file  . Highest education level: Not on file  Occupational History  . Not on file  Social Needs  . Financial resource strain: Not on file  . Food insecurity:    Worry: Not on file    Inability: Not on file  . Transportation needs:    Medical: Not on file    Non-medical: Not on file  Tobacco Use  . Smoking status: Never Smoker  . Smokeless tobacco: Never Used  Substance and Sexual Activity  . Alcohol use: No  . Drug use: No  . Sexual activity: Yes    Birth  control/protection: None  Lifestyle  . Physical activity:    Days per week: Not on file    Minutes per session: Not on file  . Stress: Not on file  Relationships  . Social connections:    Talks on phone: Not on file    Gets together: Not on file    Attends religious service: Not on file    Active member of club or organization: Not on file    Attends meetings of clubs or organizations: Not on file    Relationship status: Not on file  Other Topics Concern  . Not on file  Social History Narrative  . Not on file   Family History  Problem Relation Age of Onset  . Hypertension Mother   . Asthma Mother   . Miscarriages / [place] Mother   . Varicose Veins Mother   . Hypertension Maternal Grandmother   . Arthritis Maternal Grandmother   . COPD Maternal Grandmother   . Miscarriages / Stillbirths Maternal Grandmother   . Varicose Veins Maternal Grandmother  Current Outpatient Medications:  .  aspirin EC 81 MG tablet, Take 1 tablet (81 mg total) by mouth daily. Take after 12 weeks for prevention of preeclampsia later in pregnancy, Disp: 300 tablet, Rfl: 2 .  ibuprofen (ADVIL,MOTRIN) 600 MG tablet, Take 1 tablet (600 mg total) by mouth every 6 (six) hours as needed for mild pain. (Patient not taking: Reported on 03/05/2019), Disp: 30 tablet, Rfl: 0 .  Prenat w/o A-FeCbGl-DSS-FA-DHA (CITRANATAL 90 DHA) 90-1 & 300 MG MISC, TAKE 1 TABLET BY MOUTH DAILY., Disp: 60 each, Rfl: 6 .  Prenat w/o A-FeCbGl-DSS-FA-DHA (CITRANATAL 90 DHA) 90-1 & 300 MG MISC, Take 1 tablet by mouth daily., Disp: 30 each, Rfl: 11  No Known Allergies  Review of Systems: Negative except for what is mentioned in HPI.  Objective:   Vitals:   03/22/19 0920  BP: 121/71  Pulse: 84  Temp: 97.6 F (36.4 C)  Weight: 270 lb (122.5 kg)   Fetal Status: Fetal Heart Rate (bpm): 123         Physical Exam: BP 121/71   Pulse 84   Temp 97.6 F (36.4 C)   Wt 270 lb (122.5 kg)   LMP 01/08/2019   BMI 43.58 kg/m   CONSTITUTIONAL: Well-developed, well-nourished female in no acute distress.  NEUROLOGIC: Alert and oriented to person, place, and time. Normal reflexes, muscle tone coordination. No cranial nerve deficit noted. PSYCHIATRIC: Normal mood and affect. Normal behavior. Normal judgment and thought content. SKIN: Skin is warm and dry. No rash noted. Not diaphoretic. No erythema. No pallor. HENT:  Normocephalic, atraumatic, External right and left ear normal. Oropharynx is clear and moist EYES: Conjunctivae and EOM are normal. Pupils are equal, round, and reactive to light. No scleral icterus.  NECK: Normal range of motion, supple, no masses CARDIOVASCULAR: Normal heart rate noted, regular rhythm RESPIRATORY: Effort and breath sounds normal, no problems with respiration noted BREASTS: symmetric, non-tender, no masses palpable ABDOMEN: Soft, nontender, nondistended, gravid. GU: normal appearing external female genitalia, multiparous normal appearing cervix, scant white discharge in vagina, no lesions noted Bimanual: 12 weeks sized uterus, no adnexal tenderness or palpable lesions noted MUSCULOSKELETAL: Normal range of motion. EXT:  No edema and no tenderness. 2+ distal pulses.  Pt informed that the ultrasound is considered a limited OB ultrasound and is not intended to be a complete ultrasound exam.  Patient also informed that the ultrasound is not being completed with the intent of assessing for fetal or placental anomalies or any pelvic abnormalities.  Explained that the purpose of today's ultrasound is to assess for  viability.  Patient acknowledges the purpose of the exam and the limitations of the study.    Bedside US: intrauterine pregnancy noted, movement noted, FHR 123 bpm  Assessment and Plan:  Pregnancy: [id number] at [redacted]w[redacted]d by LMP  1. Encounter for supervision of normal pregnancy, antepartum, unspecified gravidity - Genetic Screening - Obstetric Panel, Including HIV - Culture, OB Urine -  Cytology - PAP( [place]) - Cervicovaginal ancillary only( [facility]) - Enroll Patient in Babyscripts - Babyscripts Schedule Optimization - [place] MFM OB DETAIL +14 WK; Future - had long discussion regarding BTL, patient considering, aware she will need to sign medicaid papers > 20 weeks  2. Family history of carrier of hereditary disease Husband is carrier, patient has been tested and is not a carrier  3. Obesity affecting pregnancy, antepartum To start baby ASA  4. H/O cesarean section Will need RCS  5. Herpes genitalis in women vpx 34-36  weeks Has not had any recent outbreaks   Preterm labor symptoms and general obstetric precautions including but not limited to vaginal bleeding, contractions, leaking of fluid and fetal movement were reviewed in detail with the patient.  Please refer to After Visit Summary for other counseling recommendations.   Return in about 4 weeks (around 04/19/2019).  Conan Bowens 03/22/2019 11:29 AM

## 2019-03-22 NOTE — Progress Notes (Signed)
New OB.  Blood Pressure Cuff given.  FLU given in RD, tolerated well.

## 2019-03-23 LAB — OBSTETRIC PANEL, INCLUDING HIV
Antibody Screen: NEGATIVE
Basophils Absolute: 0 10*3/uL (ref 0.0–0.2)
Basos: 1 %
EOS (ABSOLUTE): 0.2 10*3/uL (ref 0.0–0.4)
Eos: 2 %
HIV Screen 4th Generation wRfx: NONREACTIVE
Hematocrit: 43.1 % (ref 34.0–46.6)
Hemoglobin: 14.1 g/dL (ref 11.1–15.9)
Hepatitis B Surface Ag: NEGATIVE
Immature Grans (Abs): 0 10*3/uL (ref 0.0–0.1)
Immature Granulocytes: 0 %
Lymphocytes Absolute: 1.6 10*3/uL (ref 0.7–3.1)
Lymphs: 20 %
MCH: 28.5 pg (ref 26.6–33.0)
MCHC: 32.7 g/dL (ref 31.5–35.7)
MCV: 87 fL (ref 79–97)
Monocytes Absolute: 0.7 10*3/uL (ref 0.1–0.9)
Monocytes: 8 %
Neutrophils Absolute: 5.6 10*3/uL (ref 1.4–7.0)
Neutrophils: 69 %
Platelets: 241 10*3/uL (ref 150–450)
RBC: 4.94 x10E6/uL (ref 3.77–5.28)
RDW: 13.8 % (ref 11.7–15.4)
RPR Ser Ql: NONREACTIVE
Rh Factor: POSITIVE
Rubella Antibodies, IGG: 5.03 index (ref >0.99)
WBC: 8.2 10*3/uL (ref 3.4–10.8)

## 2019-03-23 LAB — CYTOLOGY - PAP
Diagnosis: NEGATIVE
HPV: NOT DETECTED

## 2019-03-23 LAB — HEMOGLOBIN A1C
Est. average glucose Bld gHb Est-mCnc: 100 mg/dL
Hgb A1c MFr Bld: 5.1 % (ref 4.8–5.6)

## 2019-03-23 LAB — CERVICOVAGINAL ANCILLARY ONLY
Bacterial vaginitis: POSITIVE — AB
Candida vaginitis: NEGATIVE
Chlamydia: NEGATIVE
Neisseria Gonorrhea: NEGATIVE
Trichomonas: NEGATIVE

## 2019-03-24 MED ORDER — METRONIDAZOLE 500 MG PO TABS: 500.0000 mg | ORAL_TABLET | Freq: Two times a day (BID) | ORAL | 0 refills | Status: DC

## 2019-03-24 NOTE — Addendum Note (Signed)
Addended by: Leroy Libman on: 03/24/2019 03:40 PM   Modules accepted: Orders

## 2019-03-28 ENCOUNTER — Encounter: Payer: Self-pay | Admitting: Obstetrics and Gynecology

## 2019-03-29 ENCOUNTER — Encounter: Payer: Self-pay | Admitting: Obstetrics and Gynecology

## 2019-03-30 ENCOUNTER — Encounter: Payer: Self-pay | Admitting: Obstetrics and Gynecology

## 2019-03-30 DIAGNOSIS — R8271 Bacteriuria: Secondary | ICD-10-CM | POA: Insufficient documentation

## 2019-03-30 LAB — URINE CULTURE, OB REFLEX

## 2019-03-30 LAB — CULTURE, OB URINE

## 2019-04-19 ENCOUNTER — Ambulatory Visit (INDEPENDENT_AMBULATORY_CARE_PROVIDER_SITE_OTHER): Payer: Medicaid Other | Admitting: Obstetrics and Gynecology

## 2019-04-19 ENCOUNTER — Other Ambulatory Visit: Payer: Self-pay

## 2019-04-19 ENCOUNTER — Encounter: Payer: Self-pay | Admitting: Obstetrics and Gynecology

## 2019-04-19 VITALS — BP 128/79

## 2019-04-19 DIAGNOSIS — Z98891 History of uterine scar from previous surgery: Secondary | ICD-10-CM

## 2019-04-19 DIAGNOSIS — R8271 Bacteriuria: Secondary | ICD-10-CM

## 2019-04-19 DIAGNOSIS — A6009 Herpesviral infection of other urogenital tract: Secondary | ICD-10-CM

## 2019-04-19 DIAGNOSIS — Z3A14 14 weeks gestation of pregnancy: Secondary | ICD-10-CM

## 2019-04-19 DIAGNOSIS — O99210 Obesity complicating pregnancy, unspecified trimester: Secondary | ICD-10-CM

## 2019-04-19 DIAGNOSIS — O99212 Obesity complicating pregnancy, second trimester: Secondary | ICD-10-CM

## 2019-04-19 DIAGNOSIS — O98812 Other maternal infectious and parasitic diseases complicating pregnancy, second trimester: Secondary | ICD-10-CM

## 2019-04-19 DIAGNOSIS — Z3490 Encounter for supervision of normal pregnancy, unspecified, unspecified trimester: Secondary | ICD-10-CM

## 2019-04-19 NOTE — Progress Notes (Signed)
   TELEHEALTH VIRTUAL OBSTETRICS PRENATAL VISIT ENCOUNTER NOTE  I connected with Jean Fox on 04/19/19 at  1:30 PM EDT by WebEx at home and verified that I am speaking with the correct person using two identifiers.   I discussed the limitations, risks, security and privacy concerns of performing an evaluation and management service by telephone and the availability of in person appointments. I also discussed with the patient that there may be a patient responsible charge related to this service. The patient expressed understanding and agreed to proceed. Subjective:  Jean Fox is a 38 y.o. [id number] at [redacted]w[redacted]d being seen today for ongoing prenatal care.  She is currently monitored for the following issues for this high-risk pregnancy and has H/O cesarean section; Herpes genitalis in women; Family history of carrier of hereditary disease; Obesity affecting pregnancy, antepartum; S/P repeat low transverse C-section; Supervision of normal pregnancy, antepartum; and GBS bacteriuria on their problem list.  Patient reports nausea has improved.  Reports fetal movement. Contractions: Not present. Vag. Bleeding: None.   . Denies any contractions, bleeding or leaking of fluid.   The following portions of the patient's history were reviewed and updated as appropriate: allergies, current medications, past family history, past medical history, past social history, past surgical history and problem list.   Objective:   Vitals:   04/19/19 1331  BP: 128/79    Fetal Status:           General:  Alert, oriented and cooperative. Patient is in no acute distress.  Respiratory: Normal respiratory effort, no problems with respiration noted  Mental Status: Normal mood and affect. Normal behavior. Normal judgment and thought content.  Rest of physical exam deferred due to type of encounter  Assessment and Plan:  Pregnancy: [id number] at [redacted]w[redacted]d  1. Encounter for supervision of normal pregnancy, antepartum,  unspecified gravidity No issues Considering BTL Anatomy scheduled for 05/22/19  2. GBS bacteriuria  3. Obesity affecting pregnancy, antepartum Cont baby ASA  4. H/O cesarean section Will need repeat 39 weeks   5. Herpes genitalis in women ppx 34 weeks    Preterm labor symptoms and general obstetric precautions including but not limited to vaginal bleeding, contractions, leaking of fluid and fetal movement were reviewed in detail with the patient. I discussed the assessment and treatment plan with the patient. The patient was provided an opportunity to ask questions and all were answered. The patient agreed with the plan and demonstrated an understanding of the instructions. The patient was advised to call back or seek an in-person office evaluation/go to MAU at [facility] for any urgent or concerning symptoms. Please refer to After Visit Summary for other counseling recommendations.   I provided 16 minutes of face-to-face via WebEx time during this encounter.  Return in about 4 weeks (around 05/17/2019) for OB visit (MD).  Future Appointments  Date Time Provider Department Center  05/22/2019 10:30 AM WH-MFC [place] 1 WH-MFCUS MFC-US    Conan Bowens, MD Center for [facility]

## 2019-04-19 NOTE — Progress Notes (Signed)
Pt is on the phone preparing for Webex visit with provider. [redacted]w[redacted]d.

## 2019-05-16 ENCOUNTER — Ambulatory Visit (INDEPENDENT_AMBULATORY_CARE_PROVIDER_SITE_OTHER): Payer: Medicaid Other | Admitting: Obstetrics and Gynecology

## 2019-05-16 ENCOUNTER — Other Ambulatory Visit: Payer: Self-pay

## 2019-05-16 ENCOUNTER — Encounter: Payer: Self-pay | Admitting: Obstetrics and Gynecology

## 2019-05-16 VITALS — BP 133/81 | HR 86 | Wt 270.0 lb

## 2019-05-16 DIAGNOSIS — Z3480 Encounter for supervision of other normal pregnancy, unspecified trimester: Secondary | ICD-10-CM

## 2019-05-16 DIAGNOSIS — R8271 Bacteriuria: Secondary | ICD-10-CM

## 2019-05-16 DIAGNOSIS — O99212 Obesity complicating pregnancy, second trimester: Secondary | ICD-10-CM

## 2019-05-16 DIAGNOSIS — Z98891 History of uterine scar from previous surgery: Secondary | ICD-10-CM

## 2019-05-16 DIAGNOSIS — O34212 Maternal care for vertical scar from previous cesarean delivery: Secondary | ICD-10-CM

## 2019-05-16 DIAGNOSIS — Z3A18 18 weeks gestation of pregnancy: Secondary | ICD-10-CM

## 2019-05-16 DIAGNOSIS — O99210 Obesity complicating pregnancy, unspecified trimester: Secondary | ICD-10-CM

## 2019-05-16 NOTE — Progress Notes (Signed)
   TELEHEALTH OBSTETRICS PRENATAL VIRTUAL VIDEO VISIT ENCOUNTER NOTE  Provider location: Center for [facility] [place]   I connected with Jean Fox on 05/16/19 at  1:45 PM EDT by WebEx Video Encounter at home and verified that I am speaking with the correct person using two identifiers.   I discussed the limitations, risks, security and privacy concerns of performing an evaluation and management service by telephone and the availability of in person appointments. I also discussed with the patient that there may be a patient responsible charge related to this service. The patient expressed understanding and agreed to proceed. Subjective:  Jean Fox is a 38 y.o. [id number] at [redacted]w[redacted]d being seen today for ongoing prenatal care.  She is currently monitored for the following issues for this high-risk pregnancy and has H/O cesarean section; Herpes genitalis in women; Family history of carrier of hereditary disease; Obesity affecting pregnancy, antepartum; S/P repeat low transverse C-section; Supervision of normal pregnancy, antepartum; and GBS bacteriuria on their problem list.  Patient reports no complaints.  Contractions: Not present. Vag. Bleeding: None.   . Denies any leaking of fluid.   The following portions of the patient's history were reviewed and updated as appropriate: allergies, current medications, past family history, past medical history, past social history, past surgical history and problem list.   Objective:   Vitals:   05/16/19 1324  BP: 133/81  Pulse: 86  Weight: 270 lb (122.5 kg)    Fetal Status:           General:  Alert, oriented and cooperative. Patient is in no acute distress.  Respiratory: Normal respiratory effort, no problems with respiration noted  Mental Status: Normal mood and affect. Normal behavior. Normal judgment and thought content.  Rest of physical exam deferred due to type of encounter  Imaging: No results found.  Assessment and  Plan:  Pregnancy: [id number] at [redacted]w[redacted]d 1. Supervision of other normal pregnancy, antepartum Patient is doing well without complaints Anatomy ultrasound on 6/2 Will arrange to have AFP done that day  2. H/O cesarean section Will be scheduled for her 5th c-section Patient is still considering BTL  3. GBS bacteriuria   4. Obesity affecting pregnancy, antepartum   Preterm labor symptoms and general obstetric precautions including but not limited to vaginal bleeding, contractions, leaking of fluid and fetal movement were reviewed in detail with the patient. I discussed the assessment and treatment plan with the patient. The patient was provided an opportunity to ask questions and all were answered. The patient agreed with the plan and demonstrated an understanding of the instructions. The patient was advised to call back or seek an in-person office evaluation/go to MAU at [facility] for any urgent or concerning symptoms. Please refer to After Visit Summary for other counseling recommendations.   I provided 15 minutes of face-to-face time during this encounter.  Return in about 4 weeks (around 06/13/2019) for [facility], ROB.  Future Appointments  Date Time Provider Department Center  05/16/2019  1:45 PM Jean Fox, Jean Gin, MD [facility] None  05/22/2019 10:30 AM WH-MFC [place] 1 WH-MFCUS MFC-US    Jean Antigua, MD Center for [employer]

## 2019-05-16 NOTE — Progress Notes (Signed)
Pt is on the phone preparing for virtual visit with provider. [redacted]w[redacted]d. Anatomy US scheduled on 05/22/19.

## 2019-05-22 ENCOUNTER — Ambulatory Visit (HOSPITAL_COMMUNITY)
Admission: RE | Admit: 2019-05-22 | Discharge: 2019-05-22 | Disposition: A | Discharge status: Home / Self Care | Payer: Medicaid Other | Source: Ambulatory Visit | Attending: Obstetrics and Gynecology | Admitting: Obstetrics and Gynecology

## 2019-05-22 ENCOUNTER — Ambulatory Visit (HOSPITAL_COMMUNITY): Payer: Medicaid Other | Admitting: *Deleted

## 2019-05-22 ENCOUNTER — Ambulatory Visit (HOSPITAL_COMMUNITY): Payer: Medicaid Other

## 2019-05-22 ENCOUNTER — Encounter (HOSPITAL_COMMUNITY): Payer: Self-pay

## 2019-05-22 ENCOUNTER — Other Ambulatory Visit: Payer: Medicaid Other

## 2019-05-22 ENCOUNTER — Other Ambulatory Visit: Payer: Self-pay

## 2019-05-22 ENCOUNTER — Other Ambulatory Visit (HOSPITAL_COMMUNITY): Payer: Self-pay | Admitting: *Deleted

## 2019-05-22 VITALS — BP 124/71 | HR 90 | Temp 98.1°F

## 2019-05-22 DIAGNOSIS — Z3490 Encounter for supervision of normal pregnancy, unspecified, unspecified trimester: Secondary | ICD-10-CM | POA: Diagnosis not present

## 2019-05-22 DIAGNOSIS — O98512 Other viral diseases complicating pregnancy, second trimester: Secondary | ICD-10-CM

## 2019-05-22 DIAGNOSIS — O34219 Maternal care for unspecified type scar from previous cesarean delivery: Secondary | ICD-10-CM | POA: Diagnosis not present

## 2019-05-22 DIAGNOSIS — O09522 Supervision of elderly multigravida, second trimester: Secondary | ICD-10-CM | POA: Diagnosis not present

## 2019-05-22 DIAGNOSIS — B009 Herpesviral infection, unspecified: Secondary | ICD-10-CM

## 2019-05-22 DIAGNOSIS — O09523 Supervision of elderly multigravida, third trimester: Secondary | ICD-10-CM | POA: Diagnosis present

## 2019-05-22 DIAGNOSIS — O99212 Obesity complicating pregnancy, second trimester: Secondary | ICD-10-CM | POA: Diagnosis not present

## 2019-05-22 DIAGNOSIS — Z363 Encounter for antenatal screening for malformations: Secondary | ICD-10-CM

## 2019-05-22 DIAGNOSIS — Z3A19 19 weeks gestation of pregnancy: Secondary | ICD-10-CM

## 2019-05-31 ENCOUNTER — Other Ambulatory Visit: Payer: Medicaid Other

## 2019-05-31 ENCOUNTER — Other Ambulatory Visit: Payer: Self-pay

## 2019-05-31 DIAGNOSIS — Z3480 Encounter for supervision of other normal pregnancy, unspecified trimester: Secondary | ICD-10-CM

## 2019-06-03 LAB — AFP, SERUM, OPEN SPINA BIFIDA
AFP MoM: 1.24
AFP Value: 50.7 ng/mL
Gest. Age on Collection Date: 20.3 weeks
Maternal Age At EDD: 36.8 yr
OSBR Risk 1 IN: 5748
Test Results:: NEGATIVE
Weight: 273 [lb_av]

## 2019-06-13 ENCOUNTER — Telehealth (INDEPENDENT_AMBULATORY_CARE_PROVIDER_SITE_OTHER): Payer: Medicaid Other | Admitting: Obstetrics & Gynecology

## 2019-06-13 ENCOUNTER — Other Ambulatory Visit: Payer: Self-pay

## 2019-06-13 VITALS — BP 130/80 | Wt 277.0 lb

## 2019-06-13 DIAGNOSIS — Z3A22 22 weeks gestation of pregnancy: Secondary | ICD-10-CM

## 2019-06-13 DIAGNOSIS — O99212 Obesity complicating pregnancy, second trimester: Secondary | ICD-10-CM

## 2019-06-13 DIAGNOSIS — A6009 Herpesviral infection of other urogenital tract: Secondary | ICD-10-CM

## 2019-06-13 DIAGNOSIS — O99210 Obesity complicating pregnancy, unspecified trimester: Secondary | ICD-10-CM

## 2019-06-13 DIAGNOSIS — O99820 Streptococcus B carrier state complicating pregnancy: Secondary | ICD-10-CM

## 2019-06-13 DIAGNOSIS — Z3480 Encounter for supervision of other normal pregnancy, unspecified trimester: Secondary | ICD-10-CM

## 2019-06-13 DIAGNOSIS — Z98891 History of uterine scar from previous surgery: Secondary | ICD-10-CM

## 2019-06-13 NOTE — Progress Notes (Signed)
   PRENATAL VISIT NOTE  Subjective:  Jean Fox is a 38 y.o. Q2I2979 at [redacted]w[redacted]d being seen today for ongoing prenatal care.  She is currently monitored for the following issues for this high-risk pregnancy and has H/O cesarean section; Herpes genitalis in women; Family history of carrier of hereditary disease; Obesity affecting pregnancy, antepartum; S/P repeat low transverse C-section; Supervision of normal pregnancy, antepartum; GBS bacteriuria; and GBS (group B Streptococcus carrier), +RV culture, currently pregnant on their problem list.  Patient reports no complaints.  Contractions: Not present. Vag. Bleeding: None.  Movement: Present. Denies leaking of fluid.   The following portions of the patient's history were reviewed and updated as appropriate: allergies, current medications, past family history, past medical history, past social history, past surgical history and problem list.   Objective:   Vitals:   06/13/19 1338  BP: 130/80    Fetal Status:     Movement: Present     General:  Alert, oriented and cooperative. Patient is in no acute distress.  Skin: Skin is warm and dry. No rash noted.   Cardiovascular: Normal heart rate noted  Respiratory: Normal respiratory effort, no problems with respiration noted  Abdomen: Soft, gravid, appropriate for gestational age.  Pain/Pressure: Absent     Pelvic: Cervical exam deferred        Extremities: Normal range of motion.     Mental Status: Normal mood and affect. Normal behavior. Normal judgment and thought content.   Assessment and Plan:  Pregnancy: G9Q1194 at [redacted]w[redacted]d 1. Obesity affecting pregnancy, antepartum  2. Supervision of other normal pregnancy, antepartum  3. H/O cesarean section  4. Herpes genitalis in women  5. GBS (group B Streptococcus carrier), +RV culture, currently pregnant  6. S/P repeat low transverse C-section - will need RLTCS  Preterm labor symptoms and general obstetric precautions including but not  limited to vaginal bleeding, contractions, leaking of fluid and fetal movement were reviewed in detail with the patient. Please refer to After Visit Summary for other counseling recommendations.   No follow-ups on file.  Future Appointments  Date Time Provider [place]  06/19/2019 10:30 AM [place] MFC-US  06/19/2019 10:30 AM WH-MFC [place] 1 WH-MFCUS MFC-US    Emily Filbert, MD

## 2019-06-19 ENCOUNTER — Ambulatory Visit (HOSPITAL_COMMUNITY): Payer: Medicaid Other | Admitting: *Deleted

## 2019-06-19 ENCOUNTER — Ambulatory Visit (HOSPITAL_COMMUNITY)
Admission: RE | Admit: 2019-06-19 | Discharge: 2019-06-19 | Disposition: A | Discharge status: Home / Self Care | Payer: Medicaid Other | Source: Ambulatory Visit | Attending: Obstetrics and Gynecology | Admitting: Obstetrics and Gynecology

## 2019-06-19 ENCOUNTER — Other Ambulatory Visit: Payer: Self-pay

## 2019-06-19 ENCOUNTER — Other Ambulatory Visit (HOSPITAL_COMMUNITY): Payer: Self-pay | Admitting: *Deleted

## 2019-06-19 ENCOUNTER — Encounter (HOSPITAL_COMMUNITY): Payer: Self-pay

## 2019-06-19 VITALS — BP 138/68 | HR 91 | Temp 98.4°F | Wt 280.0 lb

## 2019-06-19 DIAGNOSIS — O34219 Maternal care for unspecified type scar from previous cesarean delivery: Secondary | ICD-10-CM | POA: Diagnosis not present

## 2019-06-19 DIAGNOSIS — O98512 Other viral diseases complicating pregnancy, second trimester: Secondary | ICD-10-CM

## 2019-06-19 DIAGNOSIS — Z362 Encounter for other antenatal screening follow-up: Secondary | ICD-10-CM

## 2019-06-19 DIAGNOSIS — B009 Herpesviral infection, unspecified: Secondary | ICD-10-CM

## 2019-06-19 DIAGNOSIS — O09522 Supervision of elderly multigravida, second trimester: Secondary | ICD-10-CM | POA: Insufficient documentation

## 2019-06-19 DIAGNOSIS — O99212 Obesity complicating pregnancy, second trimester: Secondary | ICD-10-CM

## 2019-06-19 DIAGNOSIS — Z3A23 23 weeks gestation of pregnancy: Secondary | ICD-10-CM

## 2019-06-19 DIAGNOSIS — O09523 Supervision of elderly multigravida, third trimester: Secondary | ICD-10-CM | POA: Diagnosis present

## 2019-06-25 ENCOUNTER — Telehealth: Payer: Self-pay

## 2019-06-25 NOTE — Telephone Encounter (Signed)
Patient called back regarding [place] reaching out to her earlier. Pt states that she rechecked her BP and reading is 134/88. Pt reports good fetal movement, denies HA's or blurry vision. Pt reports she is taking baby aspirin as directed. I advised pt that should any symptoms listed above arise to call us or go to the hospital for evaluation, pt verbalizes understanding.

## 2019-06-25 NOTE — Telephone Encounter (Signed)
Attempted to reach pt by phone regarding her elevated B/P reading  Our office received a call from [employer]  B/P: 147/89  Pt was not ava left detailed message for pt to contact the office.

## 2019-07-11 ENCOUNTER — Other Ambulatory Visit: Payer: Medicaid Other

## 2019-07-11 ENCOUNTER — Ambulatory Visit (INDEPENDENT_AMBULATORY_CARE_PROVIDER_SITE_OTHER): Payer: Medicaid Other | Admitting: Obstetrics and Gynecology

## 2019-07-11 ENCOUNTER — Other Ambulatory Visit: Payer: Self-pay

## 2019-07-11 ENCOUNTER — Encounter: Payer: Self-pay | Admitting: Obstetrics and Gynecology

## 2019-07-11 VITALS — BP 128/82 | HR 93 | Wt 282.4 lb

## 2019-07-11 DIAGNOSIS — O99820 Streptococcus B carrier state complicating pregnancy: Secondary | ICD-10-CM

## 2019-07-11 DIAGNOSIS — Z98891 History of uterine scar from previous surgery: Secondary | ICD-10-CM

## 2019-07-11 DIAGNOSIS — O99212 Obesity complicating pregnancy, second trimester: Secondary | ICD-10-CM

## 2019-07-11 DIAGNOSIS — A6009 Herpesviral infection of other urogenital tract: Secondary | ICD-10-CM

## 2019-07-11 DIAGNOSIS — Z3480 Encounter for supervision of other normal pregnancy, unspecified trimester: Secondary | ICD-10-CM

## 2019-07-11 DIAGNOSIS — O99210 Obesity complicating pregnancy, unspecified trimester: Secondary | ICD-10-CM

## 2019-07-11 DIAGNOSIS — Z3A26 26 weeks gestation of pregnancy: Secondary | ICD-10-CM

## 2019-07-11 NOTE — Progress Notes (Signed)
Pt is here for ROB/GTT, [redacted]w[redacted]d.

## 2019-07-11 NOTE — Progress Notes (Signed)
   PRENATAL VISIT NOTE  Subjective:  Jean Fox is a 38 y.o. [id number] at [redacted]w[redacted]d being seen today for ongoing prenatal care.  She is currently monitored for the following issues for this high-risk pregnancy and has H/O cesarean section; Herpes genitalis in women; Family history of carrier of hereditary disease; Obesity affecting pregnancy, antepartum; S/P repeat low transverse C-section; Supervision of normal pregnancy, antepartum; GBS bacteriuria; and GBS (group B Streptococcus carrier), +RV culture, currently pregnant on their problem list.  Patient reports no complaints.  Contractions: Not present. Vag. Bleeding: None.  Movement: Present. Denies leaking of fluid.   The following portions of the patient's history were reviewed and updated as appropriate: allergies, current medications, past family history, past medical history, past social history, past surgical history and problem list.   Objective:   Vitals:   07/11/19 0908  BP: 128/82  Pulse: 93  Weight: 282 lb 6.4 oz (128.1 kg)    Fetal Status: Fetal Heart Rate (bpm): 146 Fundal Height: 29 cm Movement: Present     General:  Alert, oriented and cooperative. Patient is in no acute distress.  Skin: Skin is warm and dry. No rash noted.   Cardiovascular: Normal heart rate noted  Respiratory: Normal respiratory effort, no problems with respiration noted  Abdomen: Soft, gravid, appropriate for gestational age.  Pain/Pressure: Absent     Pelvic: Cervical exam deferred        Extremities: Normal range of motion.  Edema: Trace  Mental Status: Normal mood and affect. Normal behavior. Normal judgment and thought content.   Assessment and Plan:  Pregnancy: V7O1607 at [redacted]w[redacted]d 1. Supervision of other normal pregnancy, antepartum Patient is doing well  Third trimester labs today BTL consent signed Follow up growth ultrasound 7/28 - CBC - Glucose Tolerance, 2 Hours w/1 Hour - RPR - HIV Antibody (routine testing w rflx)  2. Obesity  affecting pregnancy, antepartum Continue ASA  3. H/O cesarean section Scheduled for repeat with BTL at 39 weeks  4. Herpes genitalis in women Prophylaxis in 3rd trimester  5. GBS (group B Streptococcus carrier), +RV culture, currently pregnant   Preterm labor symptoms and general obstetric precautions including but not limited to vaginal bleeding, contractions, leaking of fluid and fetal movement were reviewed in detail with the patient. Please refer to After Visit Summary for other counseling recommendations.   Return in about 3 weeks (around 08/01/2019) for [facility] [place].  Future Appointments  Date Time Provider [place]  07/11/2019 11:00 AM Tenasia Aull, Vickii Chafe, MD [facility] None  07/17/2019  9:45 AM WH-MFC NURSE WH-MFC MFC-US  07/17/2019  9:45 AM WH-MFC [place] 2 WH-MFCUS MFC-US    Mora Bellman, MD

## 2019-07-12 LAB — CBC
Hematocrit: 37.5 % (ref 34.0–46.6)
Hemoglobin: 12.2 g/dL (ref 11.1–15.9)
MCH: 28.2 pg (ref 26.6–33.0)
MCHC: 32.5 g/dL (ref 31.5–35.7)
MCV: 87 fL (ref 79–97)
Platelets: 211 10*3/uL (ref 150–450)
RBC: 4.33 x10E6/uL (ref 3.77–5.28)
RDW: 13.5 % (ref 11.7–15.4)
WBC: 9 10*3/uL (ref 3.4–10.8)

## 2019-07-12 LAB — RPR: RPR Ser Ql: NONREACTIVE

## 2019-07-12 LAB — HIV ANTIBODY (ROUTINE TESTING W REFLEX): HIV Screen 4th Generation wRfx: NONREACTIVE

## 2019-07-12 LAB — GLUCOSE TOLERANCE, 2 HOURS W/ 1HR
Glucose, 1 hour: 104 mg/dL (ref 65–179)
Glucose, 2 hour: 106 mg/dL (ref 65–152)
Glucose, Fasting: 77 mg/dL (ref 65–91)

## 2019-07-17 ENCOUNTER — Ambulatory Visit (HOSPITAL_COMMUNITY)
Admission: RE | Admit: 2019-07-17 | Discharge: 2019-07-17 | Disposition: A | Discharge status: Home / Self Care | Payer: Medicaid Other | Source: Ambulatory Visit | Attending: Maternal & Fetal Medicine | Admitting: Maternal & Fetal Medicine

## 2019-07-17 ENCOUNTER — Encounter (HOSPITAL_COMMUNITY): Payer: Self-pay | Admitting: *Deleted

## 2019-07-17 ENCOUNTER — Other Ambulatory Visit (HOSPITAL_COMMUNITY): Payer: Self-pay | Admitting: *Deleted

## 2019-07-17 ENCOUNTER — Other Ambulatory Visit: Payer: Self-pay

## 2019-07-17 ENCOUNTER — Ambulatory Visit (HOSPITAL_COMMUNITY): Payer: Medicaid Other | Admitting: *Deleted

## 2019-07-17 DIAGNOSIS — O99212 Obesity complicating pregnancy, second trimester: Secondary | ICD-10-CM

## 2019-07-17 DIAGNOSIS — O34219 Maternal care for unspecified type scar from previous cesarean delivery: Secondary | ICD-10-CM

## 2019-07-17 DIAGNOSIS — Z3480 Encounter for supervision of other normal pregnancy, unspecified trimester: Secondary | ICD-10-CM | POA: Diagnosis present

## 2019-07-17 DIAGNOSIS — R8271 Bacteriuria: Secondary | ICD-10-CM | POA: Diagnosis present

## 2019-07-17 DIAGNOSIS — O99820 Streptococcus B carrier state complicating pregnancy: Secondary | ICD-10-CM | POA: Diagnosis present

## 2019-07-17 DIAGNOSIS — Z6841 Body Mass Index (BMI) 40.0 and over, adult: Secondary | ICD-10-CM

## 2019-07-17 DIAGNOSIS — O09522 Supervision of elderly multigravida, second trimester: Secondary | ICD-10-CM | POA: Diagnosis not present

## 2019-07-17 DIAGNOSIS — O98512 Other viral diseases complicating pregnancy, second trimester: Secondary | ICD-10-CM

## 2019-07-17 DIAGNOSIS — Z362 Encounter for other antenatal screening follow-up: Secondary | ICD-10-CM | POA: Diagnosis not present

## 2019-07-17 DIAGNOSIS — Z3A27 27 weeks gestation of pregnancy: Secondary | ICD-10-CM

## 2019-07-17 DIAGNOSIS — B009 Herpesviral infection, unspecified: Secondary | ICD-10-CM

## 2019-08-01 ENCOUNTER — Encounter: Payer: Self-pay | Admitting: Obstetrics

## 2019-08-01 ENCOUNTER — Telehealth (INDEPENDENT_AMBULATORY_CARE_PROVIDER_SITE_OTHER): Payer: Medicaid Other | Admitting: Obstetrics

## 2019-08-01 ENCOUNTER — Other Ambulatory Visit: Payer: Self-pay

## 2019-08-01 DIAGNOSIS — Z3480 Encounter for supervision of other normal pregnancy, unspecified trimester: Secondary | ICD-10-CM

## 2019-08-01 DIAGNOSIS — O99820 Streptococcus B carrier state complicating pregnancy: Secondary | ICD-10-CM

## 2019-08-01 DIAGNOSIS — R8271 Bacteriuria: Secondary | ICD-10-CM

## 2019-08-01 DIAGNOSIS — Z3A29 29 weeks gestation of pregnancy: Secondary | ICD-10-CM

## 2019-08-01 DIAGNOSIS — Z98891 History of uterine scar from previous surgery: Secondary | ICD-10-CM

## 2019-08-01 MED ORDER — PRENATE MINI 29-0.6-0.4-350 MG PO CAPS: 1.0000 | ORAL_CAPSULE | Freq: Every day | ORAL | 3 refills | Status: DC

## 2019-08-01 NOTE — Progress Notes (Signed)
TELEHEALTH OBSTETRICS PRENATAL VIRTUAL VIDEO VISIT ENCOUNTER NOTE  Provider [facility]: [facility] [place]   I connected with Jean Aschoffabatha Aronoff on 08/01/19 at  4:00 PM EDT by WebEx OB MyChart Video Encounter at home and verified that I am speaking with the correct person using two identifiers.   I discussed the limitations, risks, security and privacy concerns of performing an evaluation and management service virtually and the availability of in person appointments. I also discussed with the patient that there may be a patient responsible charge related to this service. The patient expressed understanding and agreed to proceed. Subjective:  Jean Fox is a 38 y.o. [id number] at 1751w2d being seen today for ongoing prenatal care.  She is currently monitored for the following issues for this low-risk pregnancy and has H/O cesarean section; Herpes genitalis in women; Family history of carrier of hereditary disease; Obesity affecting pregnancy, antepartum; S/P repeat low transverse C-section; Supervision of normal pregnancy, antepartum; GBS bacteriuria; and GBS (group B Streptococcus carrier), +RV culture, currently pregnant on their problem list.  Patient reports no complaints.  Contractions: Not present. Vag. Bleeding: None.  Movement: Present. Denies any leaking of fluid.   The following portions of the patient's history were reviewed and updated as appropriate: allergies, current medications, past family history, past medical history, past social history, past surgical history and problem list.   Objective:  There were no vitals filed for this visit.  Fetal Status:     Movement: Present     General:  Alert, oriented and cooperative. Patient is in no acute distress.  Respiratory: Normal respiratory effort, no problems with respiration noted  Mental Status: Normal mood and affect. Normal behavior. Normal judgment and thought content.  Rest of physical exam deferred due to  type of encounter  Imaging: [place] Mfm Ob Follow Up  Result Date: 07/17/2019 ----------------------------------------------------------------------  OBSTETRICS REPORT                       (Signed Final 07/17/2019 10:42 am) ---------------------------------------------------------------------- Patient Info  ID #:       [id number]                          D.O.B.:  03-02-1981 (38 yrs)  Name:       Jean Fox                 Visit Date: 07/17/2019 09:41 am ---------------------------------------------------------------------- Performed By  Performed By:     Birdena CrandallYasemin Karatas        Ref. Address:     [address]                    [address]                                                             [address]                                                             [phone]  [place]                                                             [place]  Attending:        Tama High MD        [facility]  Referred By:      [facility] ---------------------------------------------------------------------- Orders   #  Description                          Code         Ordered By   1  [place] MFM OB FOLLOW UP                  78295.62     Sander Nephew  ----------------------------------------------------------------------   #  Order #                    Accession #                 Episode #   1  [id number]                  [id number]                  [id number]  ---------------------------------------------------------------------- Indications   Advanced maternal age multigravida 30+,        O09.522   second trimester (Low Risk NIPS)   Obesity complicating pregnancy, second         O99.212   trimester (45)   Previous cesarean delivery, antepartum x 4     O34.219   Herpes simplex virus (HSV)                      O98.519 B00.9   Antenatal follow-up for nonvisualized fetal    Z36.2   anatomy   [redacted] weeks gestation of pregnancy                Z3A.27  ---------------------------------------------------------------------- Vital Signs                                                 Height:        5'6" ---------------------------------------------------------------------- Fetal Evaluation  Num Of Fetuses:         1  Fetal Heart Rate(bpm):  153  Cardiac Activity:  Observed  Presentation:           Cephalic  Placenta:               Anterior  P. Cord Insertion:      Previously Visualized  Amniotic Fluid  AFI FV:      Within normal limits  AFI Sum(cm)     %Tile       Largest Pocket(cm)  18.75           73          5.56  RUQ(cm)       RLQ(cm)       LUQ(cm)        LLQ(cm)  5.56          3.35          5.45           4.39 ---------------------------------------------------------------------- Biometry  BPD:      68.7  mm     G. Age:  27w 4d         55  %    CI:        68.99   %    70 - 86                                                          FL/HC:      19.6   %    18.6 - 20.4  HC:      264.2  mm     G. Age:  28w 5d         74  %    HC/AC:      1.09        1.05 - 1.21  AC:      242.6  mm     G. Age:  28w 4d         82  %    FL/BPD:     75.3   %    71 - 87  FL:       51.7  mm     G. Age:  27w 4d         49  %    FL/AC:      21.3   %    20 - 24  Est. FW:    1187  gm    2 lb 10 oz      79  % ---------------------------------------------------------------------- OB History  Gravidity:    6         Term:   4        Prem:   0        SAB:   1  TOP:          0       Ectopic:  0        Living: 4 ---------------------------------------------------------------------- Gestational Age  LMP:           27w 1d        Date:  01/08/19                 EDD:   10/15/19  U/S Today:     28w 1d  EDD:   10/08/19  Best:          27w 1d     Det. By:  LMP  (01/08/19)          EDD:   10/15/19  ---------------------------------------------------------------------- Anatomy  Cranium:               Appears normal         LVOT:                   Appears normal  Cavum:                 Previously seen        Aortic Arch:            Appears normal  Ventricles:            Previously seen        Ductal Arch:            Appears normal  Choroid Plexus:        Previously seen        Diaphragm:              Previously seen  Cerebellum:            Appears normal         Stomach:                Appears normal, left                                                                        sided  Posterior Fossa:       Appears normal         Abdomen:                Appears normal  Nuchal Fold:           Not applicable (>20    Abdominal Wall:         Appears nml (cord                         wks GA)                                        insert, abd wall)  Face:                  Orbits and profile     Cord Vessels:           Appears normal (3                         previously seen                                vessel cord)  Lips:                  Not well visualized    Kidneys:                Appear normal  Palate:  Appears normal         Bladder:                Appears normal  Thoracic:              Appears normal         Spine:                  Previously seen  Heart:                 Appears normal         Upper Extremities:      Previously seen                         (4CH, axis, and                         situs)  RVOT:                  Appears normal         Lower Extremities:      Previously seen  Other:  Heels previously visualized. Technically difficult due to maternal          habitus and fetal position. ---------------------------------------------------------------------- Cervix Uterus Adnexa  Cervix  Length:            4.8  cm.  Normal appearance by transabdominal scan. ---------------------------------------------------------------------- Impression  Patient returned for completion of fetal anatomy  and fetal  growth assessment. Patient does not have gestational  diabetes.  Amniotic fluid is normal and good fetal activity is seen. Fetal  growth is appropriate for gestational age. Fetal anatomical  survey was completed and appears normal. ---------------------------------------------------------------------- Recommendations  An appointment was made for her to return in 4 weeks for  fetal growth assessment (maternal obesity). ----------------------------------------------------------------------                  Noralee Space, MD Electronically Signed Final Report   07/17/2019 10:42 am ----------------------------------------------------------------------   Assessment and Plan:  Pregnancy: [id number] at [redacted]w[redacted]d  1. Supervision of other normal pregnancy, antepartum Rx: - Prenat w/o A-FeCbn-Meth-FA-DHA (PRENATE MINI) 29-0.6-0.4-350 MG CAPS; Take 1 capsule by mouth daily before breakfast.  Dispense: 90 capsule; Refill: 3  2. H/O cesarean section - desires repeat C/S  3. GBS (group B Streptococcus carrier), +RV culture, currently pregnant - treat at C/S  4. GBS bacteriuria   Preterm labor symptoms and general obstetric precautions including but not limited to vaginal bleeding, contractions, leaking of fluid and fetal movement were reviewed in detail with the patient. I discussed the assessment and treatment plan with the patient. The patient was provided an opportunity to ask questions and all were answered. The patient agreed with the plan and demonstrated an understanding of the instructions. The patient was advised to call back or seek an in-person office evaluation/go to MAU at [facility] for any urgent or concerning symptoms. Please refer to After Visit Summary for other counseling recommendations.   I provided 10 minutes of face-to-face time during this encounter.  Return in about 2 weeks (around 08/15/2019) for MyChart.  Future Appointments  Date Time Provider Department  Center  08/14/2019  8:45 AM WH-MFC [place] 2 WH-MFCUS MFC-US    Coral Ceo, MD Center for [facility] 08/01/2019 4:16 PM

## 2019-08-01 NOTE — Progress Notes (Signed)
I connected with Jean Fox on 08/01/19 at  4:00 PM EDT by telephone and verified that I am speaking with the correct person using two identifiers.  Pt needs different rx for PNV's.  She's not at home and unable to check BP at this time.

## 2019-08-14 ENCOUNTER — Other Ambulatory Visit: Payer: Self-pay

## 2019-08-14 ENCOUNTER — Ambulatory Visit (HOSPITAL_COMMUNITY)
Admission: RE | Admit: 2019-08-14 | Discharge: 2019-08-14 | Disposition: A | Discharge status: Home / Self Care | Payer: Medicaid Other | Source: Ambulatory Visit | Attending: Obstetrics and Gynecology | Admitting: Obstetrics and Gynecology

## 2019-08-14 ENCOUNTER — Other Ambulatory Visit (HOSPITAL_COMMUNITY): Payer: Self-pay | Admitting: *Deleted

## 2019-08-14 DIAGNOSIS — Z362 Encounter for other antenatal screening follow-up: Secondary | ICD-10-CM

## 2019-08-14 DIAGNOSIS — O99213 Obesity complicating pregnancy, third trimester: Secondary | ICD-10-CM

## 2019-08-14 DIAGNOSIS — O09523 Supervision of elderly multigravida, third trimester: Secondary | ICD-10-CM

## 2019-08-14 DIAGNOSIS — B009 Herpesviral infection, unspecified: Secondary | ICD-10-CM

## 2019-08-14 DIAGNOSIS — O34219 Maternal care for unspecified type scar from previous cesarean delivery: Secondary | ICD-10-CM

## 2019-08-14 DIAGNOSIS — O98513 Other viral diseases complicating pregnancy, third trimester: Secondary | ICD-10-CM

## 2019-08-14 DIAGNOSIS — Z6841 Body Mass Index (BMI) 40.0 and over, adult: Secondary | ICD-10-CM

## 2019-08-14 DIAGNOSIS — Z3A31 31 weeks gestation of pregnancy: Secondary | ICD-10-CM

## 2019-08-15 ENCOUNTER — Telehealth (INDEPENDENT_AMBULATORY_CARE_PROVIDER_SITE_OTHER): Payer: Medicaid Other | Admitting: Obstetrics

## 2019-08-15 ENCOUNTER — Encounter: Payer: Self-pay | Admitting: Obstetrics

## 2019-08-15 ENCOUNTER — Other Ambulatory Visit: Payer: Self-pay

## 2019-08-15 ENCOUNTER — Telehealth: Payer: Medicaid Other | Admitting: Obstetrics

## 2019-08-15 VITALS — BP 123/72 | HR 96 | Wt 279.0 lb

## 2019-08-15 DIAGNOSIS — Z3480 Encounter for supervision of other normal pregnancy, unspecified trimester: Secondary | ICD-10-CM

## 2019-08-15 DIAGNOSIS — Z87440 Personal history of urinary (tract) infections: Secondary | ICD-10-CM

## 2019-08-15 DIAGNOSIS — Z98891 History of uterine scar from previous surgery: Secondary | ICD-10-CM

## 2019-08-15 DIAGNOSIS — O99210 Obesity complicating pregnancy, unspecified trimester: Secondary | ICD-10-CM

## 2019-08-15 DIAGNOSIS — Z3A31 31 weeks gestation of pregnancy: Secondary | ICD-10-CM

## 2019-08-15 DIAGNOSIS — O99213 Obesity complicating pregnancy, third trimester: Secondary | ICD-10-CM

## 2019-08-15 DIAGNOSIS — O360130 Maternal care for anti-D [Rh] antibodies, third trimester, not applicable or unspecified: Secondary | ICD-10-CM

## 2019-08-15 DIAGNOSIS — O09899 Supervision of other high risk pregnancies, unspecified trimester: Secondary | ICD-10-CM

## 2019-08-15 DIAGNOSIS — R8271 Bacteriuria: Secondary | ICD-10-CM

## 2019-08-15 DIAGNOSIS — O09893 Supervision of other high risk pregnancies, third trimester: Secondary | ICD-10-CM

## 2019-08-15 MED ORDER — AMOXICILLIN-POT CLAVULANATE 875-125 MG PO TABS: 1.0000 | ORAL_TABLET | Freq: Two times a day (BID) | ORAL | 0 refills | Status: DC

## 2019-08-15 NOTE — Progress Notes (Addendum)
TELEHEALTH OBSTETRICS PRENATAL VIRTUAL VIDEO VISIT ENCOUNTER NOTE  Provider location: [facility]   I connected with Jean Fox on 08/15/19 at 10:15 AM EDT by WebEx OB MyChart Video Encounter at home and verified that I am speaking with the correct person using two identifiers.   I discussed the limitations, risks, security and privacy concerns of performing an evaluation and management service virtually and the availability of in person appointments. I also discussed with the patient that there may be a patient responsible charge related to this service. The patient expressed understanding and agreed to proceed. Subjective:  Jean Fox is a 38 y.o. [id number] at [redacted]w[redacted]d being seen today for ongoing prenatal care.  She is currently monitored for the following issues for this low-risk pregnancy and has H/O cesarean section; Herpes genitalis in women; Family history of carrier of hereditary disease; Obesity affecting pregnancy, antepartum; S/P repeat low transverse C-section; Supervision of normal pregnancy, antepartum; GBS bacteriuria; and GBS (group B Streptococcus carrier), +RV culture, currently pregnant on their problem list.  Patient reports no complaints.  Contractions: Irritability. Vag. Bleeding: None.  Movement: Present. Denies any leaking of fluid.   The following portions of the patient's history were reviewed and updated as appropriate: allergies, current medications, past family history, past medical history, past social history, past surgical history and problem list.   Objective:   Vitals:   08/15/19 1017  BP: 123/72  Pulse: 96  Weight: 279 lb (126.6 kg)    Fetal Status:     Movement: Present     General:  Alert, oriented and cooperative. Patient is in no acute distress.  Respiratory: Normal respiratory effort, no problems with respiration noted  Mental Status: Normal mood and affect. Normal behavior. Normal judgment and thought content.    Rest of physical exam deferred due to type of encounter  Imaging: [place] Mfm Ob Follow Up  Result Date: 08/14/2019 ----------------------------------------------------------------------  OBSTETRICS REPORT                       (Signed Final 08/14/2019 10:16 am) ---------------------------------------------------------------------- Patient Info  ID #:       [id number]                          D.O.B.:  31-Jan-1981 (38 yrs)  Name:       [name]                 Visit Date: 08/14/2019 09:15 am ---------------------------------------------------------------------- Performed By  Performed By:     Hurman Horn          Ref. Address:      [address]  [address]                                                              [place]                                                              [id number]  Attending:        Noralee Spaceavi Shankar MD        [facility]  [facility] By[facility] ---------------------------------------------------------------------- Orders   #  Description                           Code        Ordered By   1  US MFM OB FOLLOW UP                   [id number]    [facility]  ----------------------------------------------------------------------   #  Order #                     Accession #                Episode #   1  [id number]                   [id number]                 [id number]  ---------------------------------------------------------------------- Indications   Previous cesarean delivery, antepartum x 4      O34.219   Herpes simplex virus (HSV)                      O98.519 B00.9   Obesity complicating pregnancy, third           O99.213   trimester (45)   Advanced maternal age multigravida 6935+,         47O09.523   third trimester (7236)   [redacted] weeks  gestation of pregnancy                 Z3A.31  ---------------------------------------------------------------------- Vital Signs  Weight       283                               Height:        5'6"  (lb):  BMI:         45.67 ---------------------------------------------------------------------- Fetal Evaluation  Num Of Fetuses:          1  Fetal Heart  155  Rate(bpm):  Cardiac Activity:        Observed  Presentation:            Cephalic  Placenta:                Anterior  P. Cord Insertion:       Visualized  Amniotic Fluid  AFI FV:      Within normal limits  AFI Sum(cm)     %Tile       Largest Pocket(cm)  12.42           34          5.04  RUQ(cm)       RLQ(cm)       LUQ(cm)        LLQ(cm)  5.04          1.5           2.33           3.55 ---------------------------------------------------------------------- Biometry  BPD:      80.4  mm     G. Age:  32w 2d         74  %    CI:         69.18  %    70 - 86                                                          FL/HC:       19.6  %    19.3 - 21.3  HC:      308.7  mm     G. Age:  34w 3d         93  %    HC/AC:       1.09       0.96 - 1.17  AC:      283.1  mm     G. Age:  32w 2d         80  %    FL/BPD:      75.4  %    71 - 87  FL:       60.6  mm     G. Age:  31w 4d         46  %    FL/AC:       21.4  %    20 - 24  HUM:        55  mm     G. Age:  32w 0d         69  %  LV:        7.1  mm  Est. FW:    1943   g      4 lb 5 oz     77  %                     m ---------------------------------------------------------------------- OB History  Gravidity:    6         Term:   4        Prem:   0         SAB:   1  TOP:          0       Ectopic:  0  Living: 4 ---------------------------------------------------------------------- Gestational Age  LMP:           31w 1d        Date:  01/08/19                 EDD:    10/15/19  U/S Today:     32w 5d                                        EDD:    10/04/19  Best:          31w 1d     Det. By:  LMP  (01/08/19)          EDD:     10/15/19 ---------------------------------------------------------------------- Anatomy  Cranium:               Appears normal         LVOT:                   Appears normal  Cavum:                 Appears normal         Aortic Arch:            Appears normal  Ventricles:            Appears normal         Ductal Arch:            Previously seen  Choroid Plexus:        Previously seen        Diaphragm:              Appears normal  Cerebellum:            Appears normal         Stomach:                Appears normal,                                                                        left sided  Posterior Fossa:       Appears normal         Abdomen:                Appears normal  Nuchal Fold:           Not applicable (>20    Abdominal Wall:         Previously seen                         wks GA)  Face:                  Orbits and profile     Cord Vessels:           Previously seen                         previously seen  Lips:                  Not  well visualized    Kidneys:                Appear normal  Palate:                Previously seen        Bladder:                Appears normal  Thoracic:              Appears normal         Spine:                  Previously seen  Heart:                 Appears normal         Upper Extremities:      Previously seen                         (4CH, axis, and                         situs)  RVOT:                  Appears normal         Lower Extremities:      Previously seen  Other:  Heels previously visualized. Technically difficult due to maternal          habitus and fetal position. ---------------------------------------------------------------------- Impression  Amniotic fluid is normal and good fetal activity is seen. Fetal  growth is appropriate for gestational age. ---------------------------------------------------------------------- Recommendations  -An appointment was made for her to return in 4 weeks for  fetal growth assessment.  ----------------------------------------------------------------------                  Tama High, MD Electronically Signed Final Report   08/14/2019 10:16 am ----------------------------------------------------------------------  [place] Mfm Ob Follow Up  Result Date: 07/17/2019 ----------------------------------------------------------------------  OBSTETRICS REPORT                       (Signed Final 07/17/2019 10:42 am) ---------------------------------------------------------------------- Patient Info  ID #:       [id number]                          D.O.B.:  [birth year] (38 yrs)  Name:       Johnanna Schneiders                 Visit Date: 07/17/2019 09:41 am ---------------------------------------------------------------------- Performed By  Performed By:     Wilnette Kales        Ref. Address:     [address]  [address]                                                             [place]                                                             [id number]  Attending:        Noralee Space MD        [facility]  Referred By:      [facility] ---------------------------------------------------------------------- Orders   #  Description                          Code         Ordered By   1  [place] MFM OB FOLLOW UP                  19147.82     Lin Landsman  ----------------------------------------------------------------------   #  Order #                    Accession #                 Episode #   1  [id number]                  [id number]                  [id number]  ---------------------------------------------------------------------- Indications   Advanced maternal age multigravida 66+,        O09.522   second  trimester (Low Risk NIPS)   Obesity complicating pregnancy, second         O99.212   trimester (45)   Previous cesarean delivery, antepartum x 4     O34.219   Herpes simplex virus (HSV)                     O98.519 B00.9   Antenatal follow-up for nonvisualized fetal    Z36.2   anatomy   [redacted] weeks gestation of pregnancy                Z3A.27  ---------------------------------------------------------------------- Vital Signs  Height:        5'6" ---------------------------------------------------------------------- Fetal Evaluation  Num Of Fetuses:         1  Fetal Heart Rate(bpm):  153  Cardiac Activity:       Observed  Presentation:           Cephalic  Placenta:               Anterior  P. Cord Insertion:      Previously Visualized  Amniotic Fluid  AFI FV:      Within normal limits  AFI Sum(cm)     %Tile       Largest Pocket(cm)  18.75           73          5.56  RUQ(cm)       RLQ(cm)       LUQ(cm)        LLQ(cm)  5.56          3.35          5.45           4.39 ---------------------------------------------------------------------- Biometry  BPD:      68.7  mm     G. Age:  27w 4d         55  %    CI:        68.99   %    70 - 86                                                          FL/HC:      19.6   %    18.6 - 20.4  HC:      264.2  mm     G. Age:  28w 5d         74  %    HC/AC:      1.09        1.05 - 1.21  AC:      242.6  mm     G. Age:  28w 4d         82  %    FL/BPD:     75.3   %    71 - 87  FL:       51.7  mm     G. Age:  27w 4d         49  %    FL/AC:      21.3   %    20 - 24  Est. FW:    1187  gm    2 lb 10 oz      79  % ---------------------------------------------------------------------- OB History  Gravidity:    6         Term:   4        Prem:   0        SAB:   1  TOP:          0       Ectopic:  0        Living: 4 ---------------------------------------------------------------------- Gestational Age  LMP:           27w 1d        Date:  01/08/19  EDD:   10/15/19  U/S Today:     28w 1d                                        EDD:   10/08/19  Best:          27w 1d     Det. By:  LMP  (01/08/19)          EDD:   10/15/19 ---------------------------------------------------------------------- Anatomy  Cranium:               Appears normal         LVOT:                   Appears normal  Cavum:                 Previously seen        Aortic Arch:            Appears normal  Ventricles:            Previously seen        Ductal Arch:            Appears normal  Choroid Plexus:        Previously seen        Diaphragm:              Previously seen  Cerebellum:            Appears normal         Stomach:                Appears normal, left                                                                        sided  Posterior Fossa:       Appears normal         Abdomen:                Appears normal  Nuchal Fold:           Not applicable (>20    Abdominal Wall:         Appears nml (cord                         wks GA)                                        insert, abd wall)  Face:                  Orbits and profile     Cord Vessels:           Appears normal (3                         previously seen  vessel cord)  Lips:                  Not well visualized    Kidneys:                Appear normal  Palate:                Appears normal         Bladder:                Appears normal  Thoracic:              Appears normal         Spine:                  Previously seen  Heart:                 Appears normal         Upper Extremities:      Previously seen                         (4CH, axis, and                         situs)  RVOT:                  Appears normal         Lower Extremities:      Previously seen  Other:  Heels previously visualized. Technically difficult due to maternal          habitus and fetal position. ---------------------------------------------------------------------- Cervix Uterus Adnexa  Cervix  Length:            4.8  cm.   Normal appearance by transabdominal scan. ---------------------------------------------------------------------- Impression  Patient returned for completion of fetal anatomy and fetal  growth assessment. Patient does not have gestational  diabetes.  Amniotic fluid is normal and good fetal activity is seen. Fetal  growth is appropriate for gestational age. Fetal anatomical  survey was completed and appears normal. ---------------------------------------------------------------------- Recommendations  An appointment was made for her to return in 4 weeks for  fetal growth assessment (maternal obesity). ----------------------------------------------------------------------                  Noralee Space, MD Electronically Signed Final Report   07/17/2019 10:42 am ----------------------------------------------------------------------   Assessment and Plan:  Pregnancy: [id number] at [redacted]w[redacted]d  1. Supervision of other normal pregnancy, antepartum  2. H/O cesarean section x 4  3. Obesity affecting pregnancy, antepartum  4. GBS bacteriuria - treat in labor   Preterm labor symptoms and general obstetric precautions including but not limited to vaginal bleeding, contractions, leaking of fluid and fetal movement were reviewed in detail with the patient. I discussed the assessment and treatment plan with the patient. The patient was provided an opportunity to ask questions and all were answered. The patient agreed with the plan and demonstrated an understanding of the instructions. The patient was advised to call back or seek an in-person office evaluation/go to [facility] at [facility] for any urgent or concerning symptoms. Please refer to After Visit Summary for other counseling recommendations.   I provided 10 minutes of face-to-face time during this encounter.  Return in about 2 weeks (around 08/29/2019) for MyChart.  Future Appointments  Date Time Provider Department Center  09/18/2019  8:00 AM  [facility]-MFC [place] 3 WH-MFCUS MFC-US  Coral Ceo, MD [facility] 08/15/2019

## 2019-08-15 NOTE — Addendum Note (Signed)
Addended by: Shelly Bombard on: 08/15/2019 02:43 PM   Modules accepted: Orders

## 2019-08-20 ENCOUNTER — Ambulatory Visit (INDEPENDENT_AMBULATORY_CARE_PROVIDER_SITE_OTHER): Payer: Medicaid Other | Admitting: *Deleted

## 2019-08-20 ENCOUNTER — Other Ambulatory Visit: Payer: Self-pay

## 2019-08-20 DIAGNOSIS — R8271 Bacteriuria: Secondary | ICD-10-CM

## 2019-08-20 NOTE — Progress Notes (Signed)
Patient in clinic to urine sample. Reviewing patient's chart, a Mychart message for sent on 08/15/2019 that she did not need to supply a urine sample. Dr. Jodi Mourning sent antibiotics on 08/15/2019. Left patient a voice message.  Derl Barrow, RN

## 2019-08-20 NOTE — Telephone Encounter (Signed)
Left voice message for patient that she did not need to come into clinic today for urine sample. Message was sent via [place] on 08/15/2019 that Dr. Jodi Mourning sent in an antibiotic to take 1 tab BID x 7 days.    Derl Barrow, RN

## 2019-08-29 ENCOUNTER — Telehealth (INDEPENDENT_AMBULATORY_CARE_PROVIDER_SITE_OTHER): Payer: Medicaid Other | Admitting: Obstetrics

## 2019-08-29 ENCOUNTER — Encounter: Payer: Self-pay | Admitting: Obstetrics

## 2019-08-29 DIAGNOSIS — R8271 Bacteriuria: Secondary | ICD-10-CM

## 2019-08-29 DIAGNOSIS — O0993 Supervision of high risk pregnancy, unspecified, third trimester: Secondary | ICD-10-CM

## 2019-08-29 DIAGNOSIS — O09523 Supervision of elderly multigravida, third trimester: Secondary | ICD-10-CM

## 2019-08-29 DIAGNOSIS — O98313 Other infections with a predominantly sexual mode of transmission complicating pregnancy, third trimester: Secondary | ICD-10-CM

## 2019-08-29 DIAGNOSIS — O99210 Obesity complicating pregnancy, unspecified trimester: Secondary | ICD-10-CM

## 2019-08-29 DIAGNOSIS — Z98891 History of uterine scar from previous surgery: Secondary | ICD-10-CM

## 2019-08-29 DIAGNOSIS — A6009 Herpesviral infection of other urogenital tract: Secondary | ICD-10-CM

## 2019-08-29 DIAGNOSIS — O99820 Streptococcus B carrier state complicating pregnancy: Secondary | ICD-10-CM

## 2019-08-29 DIAGNOSIS — O99213 Obesity complicating pregnancy, third trimester: Secondary | ICD-10-CM

## 2019-08-29 DIAGNOSIS — O0990 Supervision of high risk pregnancy, unspecified, unspecified trimester: Secondary | ICD-10-CM

## 2019-08-29 DIAGNOSIS — Z3A33 33 weeks gestation of pregnancy: Secondary | ICD-10-CM

## 2019-08-29 MED ORDER — VALACYCLOVIR HCL 1 G PO TABS: ORAL_TABLET | ORAL | 5 refills | Status: DC

## 2019-08-29 NOTE — Progress Notes (Signed)
TELEHEALTH OBSTETRICS PRENATAL VIRTUAL VIDEO VISIT ENCOUNTER NOTE  Provider location: [facility] [place]   I connected with Jean Fox on 08/29/19 at  4:00 PM EDT by OB MyChart Video Encounter at home and verified that I am speaking with the correct person using two identifiers.   I discussed the limitations, risks, security and privacy concerns of performing an evaluation and management service virtually and the availability of in person appointments. I also discussed with the patient that there may be a patient responsible charge related to this service. The patient expressed understanding and agreed to proceed. Subjective:  Jean Fox is a 38 y.o. I4P3295 at [redacted]w[redacted]d being seen today for ongoing prenatal care.  She is currently monitored for the following issues for this high-risk pregnancy and has H/O cesarean section; Herpes genitalis in women; Family history of carrier of hereditary disease; Obesity affecting pregnancy, antepartum; S/P repeat low transverse C-section; Supervision of normal pregnancy, antepartum; GBS bacteriuria; and GBS (group B Streptococcus carrier), +RV culture, currently pregnant on their problem list.  Patient reports active genital herpes outbreak.  Contractions: Irritability. Vag. Bleeding: None.  Movement: Present. Denies any leaking of fluid.   The following portions of the patient's history were reviewed and updated as appropriate: allergies, current medications, past family history, past medical history, past social history, past surgical history and problem list.   Objective:  There were no vitals filed for this visit.  Fetal Status:     Movement: Present     General:  Alert, oriented and cooperative. Patient is in no acute distress.  Respiratory: Normal respiratory effort, no problems with respiration noted  Mental Status: Normal mood and affect. Normal behavior. Normal judgment and thought content.  Rest of physical exam  deferred due to type of encounter  Imaging: [place] Mfm Ob Follow Up  Result Date: 08/14/2019 ----------------------------------------------------------------------  OBSTETRICS REPORT                       (Signed Final 08/14/2019 10:16 am) ---------------------------------------------------------------------- Patient Info  ID #:       [id number]                          D.O.B.:  04-24-1981 (38 yrs)  Name:       Jean Fox                 Visit Date: 08/14/2019 09:15 am ---------------------------------------------------------------------- Performed By  Performed By:     Hubert Azure          Ref. Address:      [place]                                                              [id number]  Attending:        Noralee Spaceavi Shankar MD        [facility]  [facility] By:      [facility] ---------------------------------------------------------------------- Orders   #  Description                           Code        Ordered By   1  US MFM OB FOLLOW UP                   [phone] [id number]    [facility]  ----------------------------------------------------------------------   #  Order #                     Accession #                Episode #   1  [id number]                   [id number]                 [id number]  ---------------------------------------------------------------------- Indications   Previous cesarean delivery, antepartum x 4      O34.219   Herpes simplex virus (HSV)                      O98.519 B00.9   Obesity complicating pregnancy, third           O99.213   trimester (45)   Advanced maternal age multigravida 3635+,         1O09.523   third trimester (5736)   [redacted] weeks gestation of pregnancy                  Z3A.31  ---------------------------------------------------------------------- Vital Signs  Weight       283                               Height:        5'6"  (lb):  BMI:         45.67 ---------------------------------------------------------------------- Fetal Evaluation  Num Of Fetuses:          1  Fetal Heart              155  Rate(bpm):  Cardiac Activity:        Observed  Presentation:            Cephalic  Placenta:                Anterior  P. Cord Insertion:  Visualized  Amniotic Fluid  AFI FV:      Within normal limits  AFI Sum(cm)     %Tile       Largest Pocket(cm)  12.42           34          5.04  RUQ(cm)       RLQ(cm)       LUQ(cm)        LLQ(cm)  5.04          1.5           2.33           3.55 ---------------------------------------------------------------------- Biometry  BPD:      80.4  mm     G. Age:  32w 2d         74  %    CI:         69.18  %    70 - 86                                                          FL/HC:       19.6  %    19.3 - 21.3  HC:      308.7  mm     G. Age:  34w 3d         93  %    HC/AC:       1.09       0.96 - 1.17  AC:      283.1  mm     G. Age:  32w 2d         80  %    FL/BPD:      75.4  %    71 - 87  FL:       60.6  mm     G. Age:  31w 4d         46  %    FL/AC:       21.4  %    20 - 24  HUM:        55  mm     G. Age:  32w 0d         69  %  LV:        7.1  mm  Est. FW:    1943   g      4 lb 5 oz     77  %                     m ---------------------------------------------------------------------- OB History  Gravidity:    6         Term:   4        Prem:   0         SAB:   1  TOP:          0       Ectopic:  0        Living: 4 ---------------------------------------------------------------------- Gestational Age  LMP:           31w 1d        Date:  01/08/19                 EDD:    10/15/19  U/S Today:     32w 5d                                        EDD:    10/04/19  Best:          31w 1d     Det. By:  LMP  (01/08/19)          EDD:    10/15/19  ---------------------------------------------------------------------- Anatomy  Cranium:               Appears normal         LVOT:                   Appears normal  Cavum:                 Appears normal         Aortic Arch:            Appears normal  Ventricles:            Appears normal         Ductal Arch:            Previously seen  Choroid Plexus:        Previously seen        Diaphragm:              Appears normal  Cerebellum:            Appears normal         Stomach:                Appears normal,                                                                        left sided  Posterior Fossa:       Appears normal         Abdomen:                Appears normal  Nuchal Fold:           Not applicable (>20    Abdominal Wall:         Previously seen                         wks GA)  Face:                  Orbits and profile     Cord Vessels:           Previously seen                         previously seen  Lips:                  Not well visualized    Kidneys:                Appear normal  Palate:                Previously seen        Bladder:  Appears normal  Thoracic:              Appears normal         Spine:                  Previously seen  Heart:                 Appears normal         Upper Extremities:      Previously seen                         (4CH, axis, and                         situs)  RVOT:                  Appears normal         Lower Extremities:      Previously seen  Other:  Heels previously visualized. Technically difficult due to maternal          habitus and fetal position. ---------------------------------------------------------------------- Impression  Amniotic fluid is normal and good fetal activity is seen. Fetal  growth is appropriate for gestational age. ---------------------------------------------------------------------- Recommendations  -An appointment was made for her to return in 4 weeks for  fetal growth assessment.  ----------------------------------------------------------------------                  Noralee Spaceavi Shankar, MD Electronically Signed Final Report   08/14/2019 10:16 am ----------------------------------------------------------------------   Assessment and Plan:  Pregnancy: [id number] at 2856w2d 1. Supervision of high risk pregnancy, antepartum  2. Multigravida of advanced maternal age in third trimester - Baby ASA  3. GBS (group B Streptococcus carrier), +RV culture, currently pregnant  4. GBS bacteriuria, treated - treat at C/S  5. Genital herpes affecting pregnancy in third trimester Rx: - valACYclovir (VALTREX) 1000 MG tablet; Take 1 tablet bid x 5 days prn, then take 1 tablet daily until after delivery of baby, for suppression.  Dispense: 30 tablet; Refill: 5  6. H/O cesarean section x 4 ( Low Transverse Uterine incisions ) - for repeat C/S at 39 weeks.  Considering BTL ( papers signed 07-11-2019 )  7. Obesity affecting pregnancy, antepartum   Preterm labor symptoms and general obstetric precautions including but not limited to vaginal bleeding, contractions, leaking of fluid and fetal movement were reviewed in detail with the patient. I discussed the assessment and treatment plan with the patient. The patient was provided an opportunity to ask questions and all were answered. The patient agreed with the plan and demonstrated an understanding of the instructions. The patient was advised to call back or seek an in-person office evaluation/[facility] for any urgent or concerning symptoms. Please refer to After Visit Summary for other counseling recommendations.   I provided 10 minutes of face-to-face time during this encounter.  Return in about 2 weeks (around 09/12/2019) for MyChart.  , [facility]..  Future Appointments  Date Time Provider Department Center  09/18/2019  8:00 AM [facility] US 3 [facility]US M[facility]-US  09/19/2019  8:45 AM Conan Bowensavis, Kelly M, MD [facility] None     Coral Ceoharles , MD [facility], [facility] 08/29/2019

## 2019-08-29 NOTE — Progress Notes (Signed)
Virtual ROB  CC: HSV outbreak. Pt states she is uncomfortable.

## 2019-09-18 ENCOUNTER — Ambulatory Visit (HOSPITAL_COMMUNITY)
Admission: RE | Admit: 2019-09-18 | Discharge: 2019-09-18 | Disposition: A | Discharge status: Home / Self Care | Payer: Medicaid Other | Source: Ambulatory Visit | Attending: Obstetrics and Gynecology | Admitting: Obstetrics and Gynecology

## 2019-09-18 ENCOUNTER — Other Ambulatory Visit: Payer: Self-pay

## 2019-09-18 DIAGNOSIS — B009 Herpesviral infection, unspecified: Secondary | ICD-10-CM

## 2019-09-18 DIAGNOSIS — O99213 Obesity complicating pregnancy, third trimester: Secondary | ICD-10-CM | POA: Insufficient documentation

## 2019-09-18 DIAGNOSIS — O98513 Other viral diseases complicating pregnancy, third trimester: Secondary | ICD-10-CM

## 2019-09-18 DIAGNOSIS — O09523 Supervision of elderly multigravida, third trimester: Secondary | ICD-10-CM

## 2019-09-18 DIAGNOSIS — Z362 Encounter for other antenatal screening follow-up: Secondary | ICD-10-CM

## 2019-09-18 DIAGNOSIS — O34219 Maternal care for unspecified type scar from previous cesarean delivery: Secondary | ICD-10-CM

## 2019-09-18 DIAGNOSIS — Z3A36 36 weeks gestation of pregnancy: Secondary | ICD-10-CM

## 2019-09-19 ENCOUNTER — Encounter: Payer: Self-pay | Admitting: Obstetrics and Gynecology

## 2019-09-19 ENCOUNTER — Encounter: Payer: Medicaid Other | Admitting: Obstetrics and Gynecology

## 2019-09-19 ENCOUNTER — Other Ambulatory Visit (HOSPITAL_COMMUNITY)
Admission: RE | Admit: 2019-09-19 | Discharge: 2019-09-19 | Disposition: A | Discharge status: Home / Self Care | Payer: Medicaid Other | Source: Ambulatory Visit | Attending: Obstetrics and Gynecology | Admitting: Obstetrics and Gynecology

## 2019-09-19 ENCOUNTER — Other Ambulatory Visit: Payer: Self-pay

## 2019-09-19 ENCOUNTER — Ambulatory Visit (INDEPENDENT_AMBULATORY_CARE_PROVIDER_SITE_OTHER): Payer: Medicaid Other | Admitting: Obstetrics and Gynecology

## 2019-09-19 VITALS — BP 120/82 | HR 105 | Wt 285.0 lb

## 2019-09-19 DIAGNOSIS — O99820 Streptococcus B carrier state complicating pregnancy: Secondary | ICD-10-CM

## 2019-09-19 DIAGNOSIS — Z3480 Encounter for supervision of other normal pregnancy, unspecified trimester: Secondary | ICD-10-CM

## 2019-09-19 DIAGNOSIS — Z23 Encounter for immunization: Secondary | ICD-10-CM

## 2019-09-19 DIAGNOSIS — O99210 Obesity complicating pregnancy, unspecified trimester: Secondary | ICD-10-CM

## 2019-09-19 DIAGNOSIS — Z3A36 36 weeks gestation of pregnancy: Secondary | ICD-10-CM

## 2019-09-19 DIAGNOSIS — N898 Other specified noninflammatory disorders of vagina: Secondary | ICD-10-CM

## 2019-09-19 DIAGNOSIS — R8271 Bacteriuria: Secondary | ICD-10-CM

## 2019-09-19 DIAGNOSIS — O34219 Maternal care for unspecified type scar from previous cesarean delivery: Secondary | ICD-10-CM

## 2019-09-19 DIAGNOSIS — A6009 Herpesviral infection of other urogenital tract: Secondary | ICD-10-CM

## 2019-09-19 DIAGNOSIS — Z98891 History of uterine scar from previous surgery: Secondary | ICD-10-CM

## 2019-09-19 NOTE — Progress Notes (Signed)
   PRENATAL VISIT NOTE  Subjective:  Jean Fox is a 38 y.o. C6C3762 at [redacted]w[redacted]d being seen today for ongoing prenatal care.  She is currently monitored for the following issues for this high-risk pregnancy and has H/O cesarean section; Herpes genitalis in women; Family history of carrier of hereditary disease; Obesity affecting pregnancy, antepartum; S/P repeat low transverse C-section; Supervision of normal pregnancy, antepartum; GBS bacteriuria; and GBS (group B Streptococcus carrier), +RV culture, currently pregnant on their problem list.  Patient reports vaginal itching and discharge.  Contractions: Irregular.  .  Movement: Present. Denies leaking of fluid.   The following portions of the patient's history were reviewed and updated as appropriate: allergies, current medications, past family history, past medical history, past social history, past surgical history and problem list.   Objective:   Vitals:   09/19/19 0846  BP: 120/82  Pulse: (!) 105  Weight: 285 lb (129.3 kg)   Fetal Status: Fetal Heart Rate (bpm): 138   Movement: Present     General:  Alert, oriented and cooperative. Patient is in no acute distress.  Skin: Skin is warm and dry. No rash noted.   Cardiovascular: Normal heart rate noted  Respiratory: Normal respiratory effort, no problems with respiration noted  Abdomen: Soft, gravid, appropriate for gestational age.  Pain/Pressure: Present     Pelvic: Cervical exam deferred        Extremities: Normal range of motion.  Edema: None  Mental Status: Normal mood and affect. Normal behavior. Normal judgment and thought content.   Assessment and Plan:  Pregnancy: G3T5176 at [redacted]w[redacted]d  1. Supervision of other normal pregnancy, antepartum Unsure if she wants BTL as of now  2. GBS bacteriuria  3. Obesity affecting pregnancy, antepartum  4. S/P repeat low transverse C-section Has RCS scheduled for 10/08/19  5. Herpes genitalis in women Had outbreak 3 weeks ago, started  on valtrex and now much improved - now with no lesions/symptoms Cont suppression until delivery Instructed to take 500 mg BID  Preterm labor symptoms and general obstetric precautions including but not limited to vaginal bleeding, contractions, leaking of fluid and fetal movement were reviewed in detail with the patient. Please refer to After Visit Summary for other counseling recommendations.   Return in about 1 week (around 09/26/2019) for virtual, high OB.  No future appointments.  Sloan Leiter, MD

## 2019-09-19 NOTE — Progress Notes (Signed)
Patient reports fetal movement, with irregular contractions. Pt states that she used monistat recently, but she is still having itching and discharge.

## 2019-09-20 LAB — CERVICOVAGINAL ANCILLARY ONLY
Bacterial Vaginitis (gardnerella): NEGATIVE
Candida Glabrata: NEGATIVE
Candida Vaginitis: NEGATIVE
Chlamydia: NEGATIVE
Molecular Disclaimer: NEGATIVE
Molecular Disclaimer: NEGATIVE
Molecular Disclaimer: NEGATIVE
Molecular Disclaimer: NEGATIVE
Molecular Disclaimer: NORMAL
Molecular Disclaimer: NORMAL
Neisseria Gonorrhea: NEGATIVE
Trichomonas: NEGATIVE

## 2019-09-24 ENCOUNTER — Telehealth (HOSPITAL_COMMUNITY): Payer: Self-pay | Admitting: *Deleted

## 2019-09-24 NOTE — Telephone Encounter (Signed)
Preadmission screen  

## 2019-09-25 ENCOUNTER — Encounter (HOSPITAL_COMMUNITY): Payer: Self-pay

## 2019-09-25 NOTE — Patient Instructions (Signed)
Jean Fox  09/25/2019   Your procedure is scheduled on:  10/08/2019  Arrive at [place] at Entrance C on [address] at [facility]  and [address]. You are invited to use the FREE valet parking or use the Visitor's parking deck.  Pick up the phone at the desk and dial [phone].  Call this number if you have problems the morning of surgery: [phone]  Remember:   Do not eat food:(After Midnight) Desps de medianoche.  Do not drink clear liquids: (After Midnight) Desps de medianoche.  Take these medicines the morning of surgery with A SIP OF WATER:  valtrex   Do not wear jewelry, make-up or nail polish.  Do not wear lotions, powders, or perfumes. Do not wear deodorant.  Do not shave 48 hours prior to surgery.  Do not bring valuables to the hospital.  [facility] is not   responsible for any belongings or valuables brought to the hospital.  Contacts, dentures or bridgework may not be worn into surgery.  Leave suitcase in the car. After surgery it may be brought to your room.  For patients admitted to the hospital, checkout time is 11:00 AM the day of              discharge.      Please read over the following fact sheets that you were given:     Preparing for Surgery

## 2019-09-26 ENCOUNTER — Telehealth (INDEPENDENT_AMBULATORY_CARE_PROVIDER_SITE_OTHER): Payer: Medicaid Other | Admitting: Obstetrics and Gynecology

## 2019-09-26 ENCOUNTER — Encounter: Payer: Self-pay | Admitting: Obstetrics and Gynecology

## 2019-09-26 VITALS — BP 126/83 | HR 102 | Wt 285.0 lb

## 2019-09-26 DIAGNOSIS — A6009 Herpesviral infection of other urogenital tract: Secondary | ICD-10-CM

## 2019-09-26 DIAGNOSIS — O99820 Streptococcus B carrier state complicating pregnancy: Secondary | ICD-10-CM

## 2019-09-26 DIAGNOSIS — O98313 Other infections with a predominantly sexual mode of transmission complicating pregnancy, third trimester: Secondary | ICD-10-CM

## 2019-09-26 DIAGNOSIS — Z3A37 37 weeks gestation of pregnancy: Secondary | ICD-10-CM

## 2019-09-26 DIAGNOSIS — O34219 Maternal care for unspecified type scar from previous cesarean delivery: Secondary | ICD-10-CM

## 2019-09-26 DIAGNOSIS — R8271 Bacteriuria: Secondary | ICD-10-CM

## 2019-09-26 DIAGNOSIS — Z98891 History of uterine scar from previous surgery: Secondary | ICD-10-CM

## 2019-09-26 DIAGNOSIS — O99213 Obesity complicating pregnancy, third trimester: Secondary | ICD-10-CM

## 2019-09-26 DIAGNOSIS — Z3480 Encounter for supervision of other normal pregnancy, unspecified trimester: Secondary | ICD-10-CM

## 2019-09-26 DIAGNOSIS — O99210 Obesity complicating pregnancy, unspecified trimester: Secondary | ICD-10-CM

## 2019-09-26 NOTE — Progress Notes (Signed)
TELEHEALTH OBSTETRICS PRENATAL VIRTUAL VIDEO VISIT ENCOUNTER NOTE  Provider location: [facility] [place]   I connected with Jean Fox on 09/26/19 at  4:00 PM EDT by MyChart Video Encounter at home and verified that I am speaking with the correct person using two identifiers.   I discussed the limitations, risks, security and privacy concerns of performing an evaluation and management service virtually and the availability of in person appointments. I also discussed with the patient that there may be a patient responsible charge related to this service. The patient expressed understanding and agreed to proceed. Subjective:  Jean Fox is a 38 y.o. G8Z6629 at [redacted]w[redacted]d being seen today for ongoing prenatal care.  She is currently monitored for the following issues for this high-risk pregnancy and has H/O cesarean section; Herpes genitalis in women; Family history of carrier of hereditary disease; Obesity affecting pregnancy, antepartum; S/P repeat low transverse C-section; Supervision of normal pregnancy, antepartum; GBS bacteriuria; and GBS (group B Streptococcus carrier), +RV culture, currently pregnant on their problem list.  Patient reports no complaints.  Contractions: Irritability. Vag. Bleeding: None.  Movement: Present. Denies any leaking of fluid.   The following portions of the patient's history were reviewed and updated as appropriate: allergies, current medications, past family history, past medical history, past social history, past surgical history and problem list.   Objective:   Vitals:   09/26/19 1544  BP: 126/83  Pulse: (!) 102  Weight: 285 lb (129.3 kg)    Fetal Status:     Movement: Present     General:  Alert, oriented and cooperative. Patient is in no acute distress.  Respiratory: Normal respiratory effort, no problems with respiration noted  Mental Status: Normal mood and affect. Normal behavior. Normal judgment and thought content.    Rest of physical exam deferred due to type of encounter  Imaging: [place] Mfm Ob Follow Up  Result Date: 09/18/2019 ----------------------------------------------------------------------  OBSTETRICS REPORT                       (Signed Final 09/18/2019 04:54 pm) ---------------------------------------------------------------------- Patient Info  ID #:       [id number]                          D.O.B.:  [birth year] (38 yrs)  Name:       Jean Fox                 Visit Date: 09/18/2019 08:10 am ---------------------------------------------------------------------- Performed By  Performed By:     Emeline Darling BS,      Ref. Address:      [address]  [address]                                                              [place]                                                              [address]  Attending:        Lin Landsman      Location[facility]                    MD                                        [facility]etal [facility]  Referred By:      [facility] ---------------------------------------------------------------------- Orders   #  Description                          Code         Ordered By   1  [place] MFM OB FOLLOW UP                  [phone]     RAVI SHANKAR  ----------------------------------------------------------------------   #  Order #                    Accession #                 Episode #   1  [id number]                  [id number]                  [id number]  ---------------------------------------------------------------------- Indications   [redacted] weeks gestation of pregnancy                Z3A.36   Previous cesarean delivery, antepartum x 4     O34.219   Herpes simplex virus (HSV)                     O98.519 B00.9   Obesity complicating pregnancy, third          O99.213   trimester (45)   Advanced maternal age multigravida 60+,         O63.523   third trimester (36)   Encounter for other antenatal screening        Z36.2   follow-up  ---------------------------------------------------------------------- Vital Signs  Weight (lb): 279                               Height:        5'6"  BMI:         45.03 ---------------------------------------------------------------------- Fetal Evaluation  Num Of Fetuses:          1  Fetal Heart Rate(bpm):   138  Cardiac Activity:        Observed  Presentation:            Cephalic  Placenta:                Anterior  P. Cord Insertion:       Previously Visualized  Amniotic Fluid  AFI FV:      Within normal limits  AFI Sum(cm)     %Tile       Largest Pocket(cm)  10.59           27          4.02  RUQ(cm)       RLQ(cm)       LUQ(cm)        LLQ(cm)  4.02          2.33          2.4            1.84 ---------------------------------------------------------------------- Biometry  BPD:      89.9  mm     G. Age:  36w 3d         67  %    CI:        71.41   %    70 - 86                                                          FL/HC:       21.3  %    20.1 - 22.1  HC:      338.8  mm     G. Age:  38w 6d         84  %    HC/AC:       0.96       0.93 - 1.11  AC:      351.2  mm     G. Age:  39w 0d       > 99  %    FL/BPD:      80.1  %    71 - 87  FL:         72  mm     G. Age:  36w 6d         65  %    FL/AC:       20.5  %    20 - 24  Est. FW:    3427   gm     7 lb 9 oz     95  % ---------------------------------------------------------------------- OB History  Gravidity:    6         Term:   4        Prem:   0        SAB:   1  TOP:          0       Ectopic:  0        Living: 4 ---------------------------------------------------------------------- Gestational Age  LMP:           36w 1d        Date:  01/08/19                 EDD:   10/15/19  U/S Today:     37w 6d  EDD:   10/03/19  Best:          36w 1d     Det. By:  LMP  (01/08/19)          EDD:   10/15/19  ---------------------------------------------------------------------- Anatomy  Cranium:               Appears normal         LVOT:                   Previously seen  Cavum:                 Previously seen        Aortic Arch:            Previously seen  Ventricles:            Previously seen        Ductal Arch:            Previously seen  Choroid Plexus:        Previously seen        Diaphragm:              Previously seen  Cerebellum:            Previously seen        Stomach:                Appears normal, left                                                                        sided  Posterior Fossa:       Previously seen        Abdomen:                Appears normal  Nuchal Fold:           Not applicable (>20    Abdominal Wall:         Previously seen                         wks GA)  Face:                  Orbits and profile     Cord Vessels:           Previously seen                         previously seen  Lips:                  Not well visualized    Kidneys:                Appear normal  Palate:                Previously seen        Bladder:                Appears normal  Thoracic:              Appears normal         Spine:  Previously seen  Heart:                 Previously seen        Upper Extremities:      Previously seen  RVOT:                  Previously seen        Lower Extremities:      Previously seen  Other:  Heels previously visualized. Technically difficult due to maternal          habitus and fetal position. ---------------------------------------------------------------------- Cervix Uterus Adnexa  Cervix  Not visualized (advanced GA >24wks) ---------------------------------------------------------------------- Impression  Normal interval growth.  Good fetal movement and amniotic fluid  BMI >40  AMA ---------------------------------------------------------------------- Recommendations  Follow up as clinically indicated.  ----------------------------------------------------------------------               Lin Landsman, MD Electronically Signed Final Report   09/18/2019 04:54 pm ----------------------------------------------------------------------   Assessment and Plan:  Pregnancy: J4N8295 at [redacted]w[redacted]d 1. GBS (group B Streptococcus carrier), +RV culture, currently pregnant Patient scheduled for c-section   2. Supervision of other normal pregnancy, antepartum Patient is doing well without complaints Undecided on BTL  3. S/P repeat low transverse C-section Scheduled for repeat on 10/19  4. Obesity affecting pregnancy, antepartum  5. Herpes genitalis in women Resolved. Patient remains on prophylaxis  Preterm labor symptoms and general obstetric precautions including but not limited to vaginal bleeding, contractions, leaking of fluid and fetal movement were reviewed in detail with the patient. I discussed the assessment and treatment plan with the patient. The patient was provided an opportunity to ask questions and all were answered. The patient agreed with the plan and demonstrated an understanding of the instructions. The patient was advised to call back or seek an in-person office evaluation/go to MAU at [facility] for any urgent or concerning symptoms. Please refer to After Visit Summary for other counseling recommendations.   I provided 15 minutes of face-to-face time during this encounter.  No follow-ups on file.  Future Appointments  Date Time Provider Department Center  09/26/2019  4:00 PM Cyree Chuong, Gigi Gin, MD [facility] None  10/04/2019  8:15 AM Willodean Rosenthal, MD [facility] None  10/06/2019  8:50 AM [facility] 1 [facility] None    Catalina Antigua, MD Center for [employer], [facility]

## 2019-09-26 NOTE — Progress Notes (Signed)
I connected with  Jean Fox on 09/26/19 by a video enabled telemedicine application and verified that I am speaking with the correct person using two identifiers.  MyChart ROB.  Reports no problems today.

## 2019-10-04 ENCOUNTER — Telehealth (INDEPENDENT_AMBULATORY_CARE_PROVIDER_SITE_OTHER): Payer: Medicaid Other | Admitting: Obstetrics & Gynecology

## 2019-10-04 ENCOUNTER — Encounter: Payer: Self-pay | Admitting: Obstetrics & Gynecology

## 2019-10-04 VITALS — BP 126/78

## 2019-10-04 DIAGNOSIS — Z98891 History of uterine scar from previous surgery: Secondary | ICD-10-CM

## 2019-10-04 DIAGNOSIS — Z3490 Encounter for supervision of normal pregnancy, unspecified, unspecified trimester: Secondary | ICD-10-CM

## 2019-10-04 DIAGNOSIS — Z3A38 38 weeks gestation of pregnancy: Secondary | ICD-10-CM

## 2019-10-04 DIAGNOSIS — O99213 Obesity complicating pregnancy, third trimester: Secondary | ICD-10-CM

## 2019-10-04 DIAGNOSIS — O98313 Other infections with a predominantly sexual mode of transmission complicating pregnancy, third trimester: Secondary | ICD-10-CM

## 2019-10-04 DIAGNOSIS — R8271 Bacteriuria: Secondary | ICD-10-CM

## 2019-10-04 DIAGNOSIS — Z8481 Family history of carrier of genetic disease: Secondary | ICD-10-CM

## 2019-10-04 DIAGNOSIS — A6009 Herpesviral infection of other urogenital tract: Secondary | ICD-10-CM

## 2019-10-04 DIAGNOSIS — O99820 Streptococcus B carrier state complicating pregnancy: Secondary | ICD-10-CM

## 2019-10-04 DIAGNOSIS — O99210 Obesity complicating pregnancy, unspecified trimester: Secondary | ICD-10-CM

## 2019-10-04 NOTE — Progress Notes (Signed)
Pt. Is on the phone preparing for virtual visit with provider. [redacted]w[redacted]d.

## 2019-10-04 NOTE — Progress Notes (Signed)
TELEHEALTH OBSTETRICS PRENATAL VIRTUAL VIDEO VISIT ENCOUNTER NOTE  Provider location: Center for [facility]   I connected with Jean Fox on 10/04/19 at  8:15 AM EDT by MyChart Video Encounter at home and verified that I am speaking with the correct person using two identifiers.   I discussed the limitations, risks, security and privacy concerns of performing an evaluation and management service virtually and the availability of in person appointments. I also discussed with the patient that there may be a patient responsible charge related to this service. The patient expressed understanding and agreed to proceed. Subjective:  Jean Fox is a 38 y.o. Z6X0960 at [redacted]w[redacted]d being seen today for ongoing prenatal care.  She is currently monitored for the following issues for this high-risk pregnancy and has H/O cesarean section; Herpes genitalis in women; Family history of carrier of hereditary disease; Obesity affecting pregnancy, antepartum; S/P repeat low transverse C-section; Supervision of normal pregnancy, antepartum; GBS bacteriuria; and GBS (group B Streptococcus carrier), +RV culture, currently pregnant on their problem list.  Patient reports no complaints.  Contractions: Irritability. Vag. Bleeding: None.  Movement: Present. Denies any leaking of fluid.   The following portions of the patient's history were reviewed and updated as appropriate: allergies, current medications, past family history, past medical history, past social history, past surgical history and problem list.   Objective:   Vitals:   10/04/19 0815  BP: 126/78    Fetal Status:     Movement: Present     General:  Alert, oriented and cooperative. Patient is in no acute distress.  Respiratory: Normal respiratory effort, no problems with respiration noted  Mental Status: Normal mood and affect. Normal behavior. Normal judgment and thought content.  Rest of physical exam deferred due to type of  encounter  Imaging: [place] Mfm Ob Follow Up  Result Date: 09/18/2019 ----------------------------------------------------------------------  OBSTETRICS REPORT                       (Signed Final 09/18/2019 04:54 pm) ---------------------------------------------------------------------- Patient Info  ID #:       [id number]                          D.O.B.:  1981-03-21 (38 yrs)  Name:       Jean Fox                 Visit Date: 09/18/2019 08:10 am ---------------------------------------------------------------------- Performed By  Performed By:     Emeline Darling BS,      Ref. Address:      [address]                                                              [phone]  [place]                                                              [id number]  Attending:        Lin Landsmanorenthian Booker      Location[facility]          [facility]                                        [facility]  Referred By:      [facility] ---------------------------------------------------------------------- Orders   #  Description                          Code         Ordered By   1  US MFM OB FOLLOW UP                  [phone] [id number]     [facility]  ----------------------------------------------------------------------   #  Order #                    Accession #                 Episode #   1  [id number]                  [id number]                  [id number]  ---------------------------------------------------------------------- Indications   [redacted] weeks gestation of pregnancy                Z3A.36   Previous cesarean delivery, antepartum x 4     O34.219   Herpes simplex virus (HSV)                     O98.519 B00.9   Obesity complicating pregnancy, third          O99.213   trimester 57(45)   Advanced maternal age multigravida 7035+,        95O09.523   third trimester (36)   Encounter  for other antenatal screening        Z36.2   follow-up  ---------------------------------------------------------------------- Vital Signs  Weight (lb): 279                               Height:        5'6"  BMI:         45.03 ---------------------------------------------------------------------- Fetal Evaluation  Num Of Fetuses:          1  Fetal Heart Rate(bpm):   138  Cardiac Activity:        Observed  Presentation:            Cephalic  Placenta:                Anterior  P. Cord Insertion:       Previously Visualized  Amniotic Fluid  AFI FV:  Within normal limits  AFI Sum(cm)     %Tile       Largest Pocket(cm)  10.59           27          4.02  RUQ(cm)       RLQ(cm)       LUQ(cm)        LLQ(cm)  4.02          2.33          2.4            1.84 ---------------------------------------------------------------------- Biometry  BPD:      89.9  mm     G. Age:  36w 3d         67  %    CI:        71.41   %    70 - 86                                                          FL/HC:       21.3  %    20.1 - 22.1  HC:      338.8  mm     G. Age:  38w 6d         84  %    HC/AC:       0.96       0.93 - 1.11  AC:      351.2  mm     G. Age:  39w 0d       > 99  %    FL/BPD:      80.1  %    71 - 87  FL:         72  mm     G. Age:  36w 6d         65  %    FL/AC:       20.5  %    20 - 24  Est. FW:    3427   gm     7 lb 9 oz     95  % ---------------------------------------------------------------------- OB History  Gravidity:    6         Term:   4        Prem:   0        SAB:   1  TOP:          0       Ectopic:  0        Living: 4 ---------------------------------------------------------------------- Gestational Age  LMP:           36w 1d        Date:  01/08/19                 EDD:   10/15/19  U/S Today:     37w 6d                                        EDD:   10/03/19  Best:          36w 1d     Det. By:  LMP  (01/08/19)  EDD:   10/15/19 ---------------------------------------------------------------------- Anatomy   Cranium:               Appears normal         LVOT:                   Previously seen  Cavum:                 Previously seen        Aortic Arch:            Previously seen  Ventricles:            Previously seen        Ductal Arch:            Previously seen  Choroid Plexus:        Previously seen        Diaphragm:              Previously seen  Cerebellum:            Previously seen        Stomach:                Appears normal, left                                                                        sided  Posterior Fossa:       Previously seen        Abdomen:                Appears normal  Nuchal Fold:           Not applicable (>20    Abdominal Wall:         Previously seen                         wks GA)  Face:                  Orbits and profile     Cord Vessels:           Previously seen                         previously seen  Lips:                  Not well visualized    Kidneys:                Appear normal  Palate:                Previously seen        Bladder:                Appears normal  Thoracic:              Appears normal         Spine:                  Previously seen  Heart:                 Previously seen        Upper  Extremities:      Previously seen  RVOT:                  Previously seen        Lower Extremities:      Previously seen  Other:  Heels previously visualized. Technically difficult due to maternal          habitus and fetal position. ---------------------------------------------------------------------- Cervix Uterus Adnexa  Cervix  Not visualized (advanced GA >24wks) ---------------------------------------------------------------------- Impression  Normal interval growth.  Good fetal movement and amniotic fluid  BMI >40  AMA ---------------------------------------------------------------------- Recommendations  Follow up as clinically indicated. ----------------------------------------------------------------------               Lin Landsman, MD Electronically Signed Final  Report   09/18/2019 04:54 pm ----------------------------------------------------------------------   Assessment and Plan:  Pregnancy: [id number] at [redacted]w[redacted]d 1. Encounter for supervision of normal pregnancy, antepartum, unspecified gravidity Good FM No problems  2. S/P repeat low transverse C-section For repeat on Oct 19th  3. Obesity affecting pregnancy, antepartum  4. Herpes genitalis in women  5. H/O cesarean section  6. GBS (group B Streptococcus carrier), +RV culture, currently pregnant  7. GBS bacteriuria  8. Family history of carrier of hereditary disease Father and son and are Alfonzo Feller    Term labor symptoms and general obstetric precautions including but not limited to vaginal bleeding, contractions, leaking of fluid and fetal movement were reviewed in detail with the patient. I discussed the assessment and treatment plan with the patient. The patient was provided an opportunity to ask questions and all were answered. The patient agreed with the plan and demonstrated an understanding of the instructions. The patient was advised to call back or seek an in-person office evaluation/go to MAU at [facility] for any urgent or concerning symptoms. Please refer to After Visit Summary for other counseling recommendations.   I provided 16 minutes of face-to-face time during this encounter.  Return in about 2 weeks (around 10/18/2019) for in person.  Future Appointments  Date Time Provider Department Center  10/06/2019  8:50 AM [facility]U 1 [facility] None    Willodean Rosenthal, MD Center for [facility], [facility]

## 2019-10-06 ENCOUNTER — Other Ambulatory Visit (HOSPITAL_COMMUNITY)
Admission: RE | Admit: 2019-10-06 | Discharge: 2019-10-06 | Disposition: A | Discharge status: Home / Self Care | Payer: Medicaid Other | Source: Ambulatory Visit | Attending: Family Medicine | Admitting: Family Medicine

## 2019-10-06 ENCOUNTER — Other Ambulatory Visit: Payer: Self-pay

## 2019-10-06 DIAGNOSIS — Z20828 Contact with and (suspected) exposure to other viral communicable diseases: Secondary | ICD-10-CM | POA: Insufficient documentation

## 2019-10-06 DIAGNOSIS — Z01812 Encounter for preprocedural laboratory examination: Secondary | ICD-10-CM | POA: Diagnosis present

## 2019-10-06 LAB — TYPE AND SCREEN
ABO/RH(D): A POS
Antibody Screen: NEGATIVE

## 2019-10-06 LAB — CBC
HCT: 39.2 % (ref 36.0–46.0)
Hemoglobin: 13.1 g/dL (ref 12.0–15.0)
MCH: 28.4 pg (ref 26.0–34.0)
MCHC: 33.4 g/dL (ref 30.0–36.0)
MCV: 85 fL (ref 80.0–100.0)
Platelets: 198 10*3/uL (ref 150–400)
RBC: 4.61 MIL/uL (ref 3.87–5.11)
RDW: 15.1 % (ref 11.5–15.5)
WBC: 8.1 10*3/uL (ref 4.0–10.5)
nRBC: 0 % (ref 0.0–0.2)

## 2019-10-06 LAB — SARS CORONAVIRUS 2 BY RT PCR (HOSPITAL ORDER, PERFORMED IN ~~LOC~~ HOSPITAL LAB): SARS Coronavirus 2: NEGATIVE

## 2019-10-06 LAB — ABO/RH: ABO/RH(D): A POS

## 2019-10-06 LAB — RPR: RPR Ser Ql: NONREACTIVE

## 2019-10-06 NOTE — MAU Note (Signed)
Pt here for PAT covid swab. Denies symptoms. Swab collected. Pt verbalizes understanding to not remove blood bank bracelet.  

## 2019-10-08 ENCOUNTER — Other Ambulatory Visit: Payer: Self-pay

## 2019-10-08 ENCOUNTER — Encounter (HOSPITAL_COMMUNITY): Payer: Self-pay | Admitting: *Deleted

## 2019-10-08 ENCOUNTER — Inpatient Hospital Stay (HOSPITAL_COMMUNITY)
Admission: RE | Admit: 2019-10-08 | Discharge: 2019-10-10 | DRG: 784 | Disposition: A | Discharge status: Home / Self Care | Payer: Medicaid Other | Attending: Family Medicine | Admitting: Family Medicine

## 2019-10-08 ENCOUNTER — Encounter (HOSPITAL_COMMUNITY)
Admission: RE | Disposition: A | Discharge status: Home / Self Care | Payer: Self-pay | Source: Home / Self Care | Attending: Family Medicine

## 2019-10-08 ENCOUNTER — Inpatient Hospital Stay (HOSPITAL_COMMUNITY): Payer: Medicaid Other | Admitting: Anesthesiology

## 2019-10-08 DIAGNOSIS — Z20828 Contact with and (suspected) exposure to other viral communicable diseases: Secondary | ICD-10-CM | POA: Diagnosis present

## 2019-10-08 DIAGNOSIS — A6009 Herpesviral infection of other urogenital tract: Secondary | ICD-10-CM | POA: Diagnosis present

## 2019-10-08 DIAGNOSIS — J45909 Unspecified asthma, uncomplicated: Secondary | ICD-10-CM | POA: Diagnosis present

## 2019-10-08 DIAGNOSIS — O9952 Diseases of the respiratory system complicating childbirth: Secondary | ICD-10-CM | POA: Diagnosis present

## 2019-10-08 DIAGNOSIS — O99824 Streptococcus B carrier state complicating childbirth: Secondary | ICD-10-CM | POA: Diagnosis present

## 2019-10-08 DIAGNOSIS — A6000 Herpesviral infection of urogenital system, unspecified: Secondary | ICD-10-CM | POA: Diagnosis present

## 2019-10-08 DIAGNOSIS — O99210 Obesity complicating pregnancy, unspecified trimester: Secondary | ICD-10-CM | POA: Diagnosis present

## 2019-10-08 DIAGNOSIS — Z302 Encounter for sterilization: Secondary | ICD-10-CM

## 2019-10-08 DIAGNOSIS — O9832 Other infections with a predominantly sexual mode of transmission complicating childbirth: Secondary | ICD-10-CM | POA: Diagnosis present

## 2019-10-08 DIAGNOSIS — Z3A39 39 weeks gestation of pregnancy: Secondary | ICD-10-CM

## 2019-10-08 DIAGNOSIS — R8271 Bacteriuria: Secondary | ICD-10-CM | POA: Diagnosis present

## 2019-10-08 DIAGNOSIS — O99214 Obesity complicating childbirth: Secondary | ICD-10-CM | POA: Diagnosis present

## 2019-10-08 DIAGNOSIS — Z98891 History of uterine scar from previous surgery: Secondary | ICD-10-CM | POA: Diagnosis present

## 2019-10-08 DIAGNOSIS — O34211 Maternal care for low transverse scar from previous cesarean delivery: Secondary | ICD-10-CM | POA: Diagnosis present

## 2019-10-08 LAB — CBC
HCT: 38.1 % (ref 36.0–46.0)
Hemoglobin: 13 g/dL (ref 12.0–15.0)
MCH: 29.1 pg (ref 26.0–34.0)
MCHC: 34.1 g/dL (ref 30.0–36.0)
MCV: 85.4 fL (ref 80.0–100.0)
Platelets: 191 10*3/uL (ref 150–400)
RBC: 4.46 MIL/uL (ref 3.87–5.11)
RDW: 14.9 % (ref 11.5–15.5)
WBC: 14.9 10*3/uL — ABNORMAL HIGH (ref 4.0–10.5)
nRBC: 0 % (ref 0.0–0.2)

## 2019-10-08 LAB — CREATININE, SERUM
Creatinine, Ser: 0.59 mg/dL (ref 0.44–1.00)
GFR calc Af Amer: >60 mL/min (ref >60)
GFR calc non Af Amer: >60 mL/min (ref >60)

## 2019-10-08 SURGERY — Surgical Case
Anesthesia: Spinal | Laterality: Bilateral

## 2019-10-08 MED ORDER — OXYTOCIN 40 UNITS IN NORMAL SALINE INFUSION - SIMPLE MED
INTRAVENOUS | Status: AC
  Filled 2019-10-08: qty 1000

## 2019-10-08 MED ORDER — OXYCODONE HCL 5 MG PO TABS: 5.0000 mg | ORAL_TABLET | ORAL | Status: DC | PRN

## 2019-10-08 MED ORDER — SENNOSIDES-DOCUSATE SODIUM 8.6-50 MG PO TABS
2.0000 | ORAL_TABLET | ORAL | Status: DC
  Administered 2019-10-08 – 2019-10-09 (×2): 2 via ORAL
  Filled 2019-10-08 (×2): qty 2

## 2019-10-08 MED ORDER — PRENATAL MULTIVITAMIN CH
1.0000 | ORAL_TABLET | Freq: Every day | ORAL | Status: DC
  Administered 2019-10-09: 1 via ORAL
  Filled 2019-10-08: qty 1

## 2019-10-08 MED ORDER — MEPERIDINE HCL 25 MG/ML IJ SOLN: 6.2500 mg | INTRAMUSCULAR | Status: DC | PRN

## 2019-10-08 MED ORDER — TETANUS-DIPHTH-ACELL PERTUSSIS 5-2.5-18.5 LF-MCG/0.5 IM SUSP: 0.5000 mL | Freq: Once | INTRAMUSCULAR | Status: DC

## 2019-10-08 MED ORDER — ONDANSETRON HCL 4 MG/2ML IJ SOLN
4.0000 mg | Freq: Three times a day (TID) | INTRAMUSCULAR | Status: DC | PRN
  Administered 2019-10-08: 4 mg via INTRAVENOUS
  Filled 2019-10-08: qty 2

## 2019-10-08 MED ORDER — SODIUM CHLORIDE 0.9 % IV SOLN
2.0000 g | INTRAVENOUS | Status: AC
  Administered 2019-10-08: 2 g via INTRAVENOUS

## 2019-10-08 MED ORDER — METOCLOPRAMIDE HCL 5 MG/ML IJ SOLN: 10.0000 mg | Freq: Once | INTRAMUSCULAR | Status: DC | PRN

## 2019-10-08 MED ORDER — MENTHOL 3 MG MT LOZG: 1.0000 | LOZENGE | OROMUCOSAL | Status: DC | PRN

## 2019-10-08 MED ORDER — WITCH HAZEL-GLYCERIN EX PADS: 1.0000 "application " | MEDICATED_PAD | CUTANEOUS | Status: DC | PRN

## 2019-10-08 MED ORDER — ONDANSETRON HCL 4 MG/2ML IJ SOLN
INTRAMUSCULAR | Status: DC | PRN
  Administered 2019-10-08: 4 mg via INTRAVENOUS

## 2019-10-08 MED ORDER — DIPHENHYDRAMINE HCL 25 MG PO CAPS: 25.0000 mg | ORAL_CAPSULE | ORAL | Status: DC | PRN

## 2019-10-08 MED ORDER — ZOLPIDEM TARTRATE 5 MG PO TABS: 5.0000 mg | ORAL_TABLET | Freq: Every evening | ORAL | Status: DC | PRN

## 2019-10-08 MED ORDER — IBUPROFEN 600 MG PO TABS
600.0000 mg | ORAL_TABLET | Freq: Four times a day (QID) | ORAL | Status: DC | PRN
  Administered 2019-10-09 – 2019-10-10 (×4): 600 mg via ORAL
  Filled 2019-10-08 (×3): qty 1

## 2019-10-08 MED ORDER — COCONUT OIL OIL: 1.0000 "application " | TOPICAL_OIL | Status: DC | PRN

## 2019-10-08 MED ORDER — PHENYLEPHRINE 40 MCG/ML (10ML) SYRINGE FOR IV PUSH (FOR BLOOD PRESSURE SUPPORT)
PREFILLED_SYRINGE | INTRAVENOUS | Status: AC
  Filled 2019-10-08: qty 10

## 2019-10-08 MED ORDER — NALOXONE HCL 0.4 MG/ML IJ SOLN: 0.4000 mg | INTRAMUSCULAR | Status: DC | PRN

## 2019-10-08 MED ORDER — KETOROLAC TROMETHAMINE 30 MG/ML IJ SOLN: 30.0000 mg | Freq: Four times a day (QID) | INTRAMUSCULAR | Status: AC | PRN

## 2019-10-08 MED ORDER — DEXAMETHASONE SODIUM PHOSPHATE 10 MG/ML IJ SOLN
INTRAMUSCULAR | Status: DC | PRN
  Administered 2019-10-08: 10 mg via INTRAVENOUS

## 2019-10-08 MED ORDER — NALBUPHINE HCL 10 MG/ML IJ SOLN
5.0000 mg | Freq: Once | INTRAMUSCULAR | Status: DC | PRN
  Filled 2019-10-08: qty 0.5

## 2019-10-08 MED ORDER — SIMETHICONE 80 MG PO CHEW: 80.0000 mg | CHEWABLE_TABLET | ORAL | Status: DC | PRN

## 2019-10-08 MED ORDER — PHENYLEPHRINE 40 MCG/ML (10ML) SYRINGE FOR IV PUSH (FOR BLOOD PRESSURE SUPPORT)
PREFILLED_SYRINGE | INTRAVENOUS | Status: DC | PRN
  Administered 2019-10-08: 80 ug via INTRAVENOUS

## 2019-10-08 MED ORDER — DIPHENHYDRAMINE HCL 50 MG/ML IJ SOLN: 12.5000 mg | INTRAMUSCULAR | Status: DC | PRN

## 2019-10-08 MED ORDER — LACTATED RINGERS IV SOLN: INTRAVENOUS | Status: DC

## 2019-10-08 MED ORDER — NALOXONE HCL 4 MG/10ML IJ SOLN
1.0000 ug/kg/h | INTRAVENOUS | Status: DC | PRN
  Filled 2019-10-08: qty 5

## 2019-10-08 MED ORDER — SODIUM CHLORIDE 0.9 % IV SOLN
INTRAVENOUS | Status: DC | PRN
  Administered 2019-10-08: 10:00:00 via INTRAVENOUS

## 2019-10-08 MED ORDER — BUPIVACAINE IN DEXTROSE 0.75-8.25 % IT SOLN
INTRATHECAL | Status: DC | PRN
  Administered 2019-10-08: 11.25 mg via INTRATHECAL

## 2019-10-08 MED ORDER — DIPHENHYDRAMINE HCL 25 MG PO CAPS: 25.0000 mg | ORAL_CAPSULE | Freq: Four times a day (QID) | ORAL | Status: DC | PRN

## 2019-10-08 MED ORDER — BUPIVACAINE HCL (PF) 0.25 % IJ SOLN
INTRAMUSCULAR | Status: DC | PRN
  Administered 2019-10-08: 30 mL

## 2019-10-08 MED ORDER — OXYTOCIN 40 UNITS IN NORMAL SALINE INFUSION - SIMPLE MED
INTRAVENOUS | Status: DC | PRN
  Administered 2019-10-08: 40 [IU] via INTRAVENOUS

## 2019-10-08 MED ORDER — SCOPOLAMINE 1 MG/3DAYS TD PT72
1.0000 | MEDICATED_PATCH | Freq: Once | TRANSDERMAL | Status: DC
  Administered 2019-10-08: 1.5 mg via TRANSDERMAL

## 2019-10-08 MED ORDER — LACTATED RINGERS IV SOLN
INTRAVENOUS | Status: DC
  Administered 2019-10-08 (×2): via INTRAVENOUS

## 2019-10-08 MED ORDER — KETOROLAC TROMETHAMINE 30 MG/ML IJ SOLN
INTRAMUSCULAR | Status: AC
  Filled 2019-10-08: qty 1

## 2019-10-08 MED ORDER — SCOPOLAMINE 1 MG/3DAYS TD PT72
MEDICATED_PATCH | TRANSDERMAL | Status: AC
  Filled 2019-10-08: qty 1

## 2019-10-08 MED ORDER — SIMETHICONE 80 MG PO CHEW
80.0000 mg | CHEWABLE_TABLET | ORAL | Status: DC
  Administered 2019-10-08 – 2019-10-09 (×2): 80 mg via ORAL
  Filled 2019-10-08 (×2): qty 1

## 2019-10-08 MED ORDER — SIMETHICONE 80 MG PO CHEW
80.0000 mg | CHEWABLE_TABLET | Freq: Three times a day (TID) | ORAL | Status: DC
  Administered 2019-10-08 – 2019-10-10 (×4): 80 mg via ORAL
  Filled 2019-10-08 (×4): qty 1

## 2019-10-08 MED ORDER — MORPHINE SULFATE (PF) 0.5 MG/ML IJ SOLN
INTRAMUSCULAR | Status: DC | PRN
  Administered 2019-10-08: .15 mg via INTRATHECAL

## 2019-10-08 MED ORDER — ONDANSETRON HCL 4 MG/2ML IJ SOLN
INTRAMUSCULAR | Status: AC
  Filled 2019-10-08: qty 2

## 2019-10-08 MED ORDER — OXYTOCIN 40 UNITS IN NORMAL SALINE INFUSION - SIMPLE MED: 2.5000 [IU]/h | INTRAVENOUS | Status: AC

## 2019-10-08 MED ORDER — BUPIVACAINE HCL (PF) 0.25 % IJ SOLN
INTRAMUSCULAR | Status: AC
  Filled 2019-10-08: qty 30

## 2019-10-08 MED ORDER — MORPHINE SULFATE (PF) 0.5 MG/ML IJ SOLN
INTRAMUSCULAR | Status: AC
  Filled 2019-10-08: qty 10

## 2019-10-08 MED ORDER — NALBUPHINE HCL 10 MG/ML IJ SOLN
5.0000 mg | INTRAMUSCULAR | Status: DC | PRN
  Filled 2019-10-08: qty 0.5

## 2019-10-08 MED ORDER — ACETAMINOPHEN 500 MG PO TABS
1000.0000 mg | ORAL_TABLET | Freq: Four times a day (QID) | ORAL | Status: AC
  Administered 2019-10-08 – 2019-10-09 (×3): 1000 mg via ORAL
  Filled 2019-10-08 (×3): qty 2

## 2019-10-08 MED ORDER — PHENYLEPHRINE HCL-NACL 20-0.9 MG/250ML-% IV SOLN
INTRAVENOUS | Status: AC
  Filled 2019-10-08: qty 250

## 2019-10-08 MED ORDER — ACETAMINOPHEN 325 MG PO TABS
650.0000 mg | ORAL_TABLET | Freq: Four times a day (QID) | ORAL | Status: DC | PRN
  Administered 2019-10-09 – 2019-10-10 (×2): 650 mg via ORAL
  Filled 2019-10-08 (×2): qty 2

## 2019-10-08 MED ORDER — KETOROLAC TROMETHAMINE 30 MG/ML IJ SOLN
30.0000 mg | Freq: Four times a day (QID) | INTRAMUSCULAR | Status: AC | PRN
  Administered 2019-10-08: 30 mg via INTRAMUSCULAR

## 2019-10-08 MED ORDER — ENOXAPARIN SODIUM 80 MG/0.8ML ~~LOC~~ SOLN
0.5000 mg/kg | SUBCUTANEOUS | Status: DC
  Administered 2019-10-09 – 2019-10-10 (×2): 65 mg via SUBCUTANEOUS
  Filled 2019-10-08 (×2): qty 0.8

## 2019-10-08 MED ORDER — DEXAMETHASONE SODIUM PHOSPHATE 10 MG/ML IJ SOLN
INTRAMUSCULAR | Status: AC
  Filled 2019-10-08: qty 1

## 2019-10-08 MED ORDER — PHENYLEPHRINE HCL-NACL 20-0.9 MG/250ML-% IV SOLN
INTRAVENOUS | Status: DC | PRN
  Administered 2019-10-08: 60 ug/min via INTRAVENOUS

## 2019-10-08 MED ORDER — DIBUCAINE (PERIANAL) 1 % EX OINT: 1.0000 "application " | TOPICAL_OINTMENT | CUTANEOUS | Status: DC | PRN

## 2019-10-08 MED ORDER — SODIUM CHLORIDE 0.9% FLUSH: 3.0000 mL | INTRAVENOUS | Status: DC | PRN

## 2019-10-08 MED ORDER — FENTANYL CITRATE (PF) 100 MCG/2ML IJ SOLN
INTRAMUSCULAR | Status: AC
  Filled 2019-10-08: qty 2

## 2019-10-08 MED ORDER — SODIUM CHLORIDE 0.9 % IV SOLN
INTRAVENOUS | Status: AC
  Filled 2019-10-08: qty 2

## 2019-10-08 MED ORDER — SODIUM CHLORIDE 0.9 % IR SOLN
Status: DC | PRN
  Administered 2019-10-08: 1

## 2019-10-08 MED ORDER — FENTANYL CITRATE (PF) 100 MCG/2ML IJ SOLN: 25.0000 ug | INTRAMUSCULAR | Status: DC | PRN

## 2019-10-08 MED ORDER — FENTANYL CITRATE (PF) 100 MCG/2ML IJ SOLN
INTRAMUSCULAR | Status: DC | PRN
  Administered 2019-10-08: 15 ug via INTRATHECAL

## 2019-10-08 SURGICAL SUPPLY — 42 items
BENZOIN TINCTURE PRP APPL 2/3 (GAUZE/BANDAGES/DRESSINGS) ×3 IMPLANT
CHLORAPREP W/TINT 26ML (MISCELLANEOUS) ×3 IMPLANT
CLAMP CORD UMBIL (MISCELLANEOUS) IMPLANT
CLOSURE WOUND 1/2 X4 (GAUZE/BANDAGES/DRESSINGS) ×1
CLOTH BEACON ORANGE TIMEOUT ST (SAFETY) ×3 IMPLANT
DRSG OPSITE POSTOP 4X10 (GAUZE/BANDAGES/DRESSINGS) ×3 IMPLANT
ELECT REM PT RETURN 9FT ADLT (ELECTROSURGICAL) ×3
ELECTRODE REM PT RTRN 9FT ADLT (ELECTROSURGICAL) ×1 IMPLANT
EXTRACTOR VACUUM M CUP 4 TUBE (SUCTIONS) IMPLANT
EXTRACTOR VACUUM M CUP 4' TUBE (SUCTIONS)
GLOVE BIOGEL PI IND STRL 7.0 (GLOVE) ×3 IMPLANT
GLOVE BIOGEL PI INDICATOR 7.0 (GLOVE) ×6
GLOVE ECLIPSE 7.0 STRL STRAW (GLOVE) ×3 IMPLANT
GOWN STRL REUS W/TWL LRG LVL3 (GOWN DISPOSABLE) ×9 IMPLANT
HEMOSTAT ARISTA ABSORB 3G PWDR (HEMOSTASIS) ×3 IMPLANT
HOVERMATT SINGLE USE (MISCELLANEOUS) ×3 IMPLANT
KIT ABG SYR 3ML LUER SLIP (SYRINGE) IMPLANT
NDL SAFETY ECLIPSE 18X1.5 (NEEDLE) ×1 IMPLANT
NEEDLE HYPO 18GX1.5 SHARP (NEEDLE) ×2
NEEDLE HYPO 22GX1.5 SAFETY (NEEDLE) ×3 IMPLANT
NEEDLE HYPO 25X5/8 SAFETYGLIDE (NEEDLE) IMPLANT
NS IRRIG 1000ML POUR BTL (IV SOLUTION) ×3 IMPLANT
PACK C SECTION WH (CUSTOM PROCEDURE TRAY) ×3 IMPLANT
PAD ABD 7.5X8 STRL (GAUZE/BANDAGES/DRESSINGS) ×3 IMPLANT
PAD OB MATERNITY 4.3X12.25 (PERSONAL CARE ITEMS) ×3 IMPLANT
PENCIL SMOKE EVAC W/HOLSTER (ELECTROSURGICAL) ×3 IMPLANT
RTRCTR C-SECT PINK 25CM LRG (MISCELLANEOUS) IMPLANT
SPONGE GAUZE 4X4 12PLY STER LF (GAUZE/BANDAGES/DRESSINGS) ×6 IMPLANT
STAPLER VISISTAT 35W (STAPLE) ×3 IMPLANT
STRIP CLOSURE SKIN 1/2X4 (GAUZE/BANDAGES/DRESSINGS) ×2 IMPLANT
SUT PDS AB 0 CTX 60 (SUTURE) ×6 IMPLANT
SUT PLAIN 2 0 (SUTURE) ×4
SUT PLAIN 2 0 XLH (SUTURE) ×3 IMPLANT
SUT PLAIN ABS 2-0 CT1 27XMFL (SUTURE) ×2 IMPLANT
SUT VIC AB 0 CTX 36 (SUTURE) ×6
SUT VIC AB 0 CTX36XBRD ANBCTRL (SUTURE) ×3 IMPLANT
SUT VIC AB 4-0 KS 27 (SUTURE) ×3 IMPLANT
SYR 30ML LL (SYRINGE) ×6 IMPLANT
TAPE CLOTH SURG 4X10 WHT LF (GAUZE/BANDAGES/DRESSINGS) ×3 IMPLANT
TOWEL OR 17X24 6PK STRL BLUE (TOWEL DISPOSABLE) ×3 IMPLANT
TRAY FOLEY W/BAG SLVR 14FR LF (SET/KITS/TRAYS/PACK) ×3 IMPLANT
WATER STERILE IRR 1000ML POUR (IV SOLUTION) ×3 IMPLANT

## 2019-10-08 NOTE — H&P (Signed)
Obstetric Preoperative History and Physical  Jean Fox is a 38 y.o. [id number] with IUP at 2239w0d presenting for scheduled cesarean section.  Reports good fetal movement, no bleeding, no contractions, no leaking of fluid.  No acute preoperative concerns.    Cesarean Section Indication: 4 prior c-sections  Prenatal Course Source of Care: [facility] weeks with onset of care at 10 weeks Pregnancy complications or risks: Patient Active Problem List   Diagnosis Date Noted  . [redacted] weeks gestation of pregnancy 10/08/2019  . GBS (group B Streptococcus carrier), +RV culture, currently pregnant 06/13/2019  . GBS bacteriuria 03/30/2019  . Supervision of normal pregnancy, antepartum 03/21/2019  . S/P repeat low transverse C-section 05/25/2017  . Obesity affecting pregnancy, antepartum 02/15/2017  . Family history of carrier of hereditary disease 12/02/2016  . H/O cesarean section 11/16/2016  . Herpes genitalis in women 11/16/2016   She plans to breastfeed She desires bilateral tubal ligation for postpartum contraception.   Prenatal labs and studies: ABO, Rh: --/--/A POS (10/17 14780938) Antibody: NEG (10/17 [id number]) Rubella: 5.03 (04/02 1023) RPR: NON REACTIVE (10/17 0938)  HBsAg: Negative (04/02 1023)  HIV: Non Reactive (07/22 0949)  GBS:  2 hr Glucola  WNL Genetic screening normal Anatomy US normal  Prenatal Transfer Tool  Maternal Diabetes: No Genetic Screening: Normal Maternal Ultrasounds/Referrals: Normal Fetal Ultrasounds or other Referrals:  None Maternal Substance Abuse:  No Significant Maternal Medications: None Significant Maternal Lab Results: Group B Strep positive  Past Medical History:  Diagnosis Date  . Asthma   . HSV (herpes simplex virus) anogenital infection     Past Surgical History:  Procedure Laterality Date  . CESAREAN SECTION    . CESAREAN SECTION N/A 05/25/2017   Procedure: REPEAT CESAREAN SECTION;  Surgeon: Federico FlakeNewton, Kimberly Niles, MD;  [facility];  Service: Obstetrics;  Laterality: N/A;  PICO DRESSING    OB History  Gravida Para Term Preterm AB Living  6 4 4   1 4   SAB TAB Ectopic Multiple Live Births  1     0 4    # Outcome Date GA Lbr Len/2nd Weight Sex Delivery Anes PTL Lv  6 Current           5 Term 05/25/17 7850w1d  3705 g M CS-LTranv Spinal  LIV  4 Term 06/05/14 1548w0d   M CS-LTranv   LIV  3 Term 11/30/09 248w0d   F CS-LTranv   LIV  2 Term 03/06/08 7848w0d   M CS-LTranv   LIV  1 SAB             Social History   Socioeconomic History  . Marital status: Married    Spouse name: Not on file  . Number of children: Not on file  . Years of education: Not on file  . Highest education level: Not on file  Occupational History  . Not on file  Social Needs  . Financial resource strain: Not hard at all  . Food insecurity    Worry: Never true    Inability: Never true  . Transportation needs    Medical: No    Non-medical: No  Tobacco Use  . Smoking status: Never Smoker  . Smokeless tobacco: Never Used  Substance and Sexual Activity  . Alcohol use: No  . Drug use: No  . Sexual activity: Yes    Birth control/protection: None  Lifestyle  . Physical activity    Days per week: Not on file    Minutes per session:  Not on file  . Stress: Not on file  Relationships  . Social Musician on phone: Not on file    Gets together: Not on file    Attends religious service: Not on file    Active member of club or organization: Not on file    Attends meetings of clubs or organizations: Not on file    Relationship status: Not on file  Other Topics Concern  . Not on file  Social History Narrative  . Not on file    Family History  Problem Relation Age of Onset  . Hypertension Mother   . Asthma Mother   . Miscarriages / [place] Mother   . Varicose Veins Mother   . Hypertension Maternal Grandmother   . Arthritis Maternal Grandmother   . COPD Maternal Grandmother   . Miscarriages / Stillbirths Maternal  Grandmother   . Varicose Veins Maternal Grandmother     Medications Prior to Admission  Medication Sig Dispense Refill Last Dose  . aspirin EC 81 MG tablet Take 1 tablet (81 mg total) by mouth daily. Take after 12 weeks for prevention of preeclampsia later in pregnancy (Patient taking differently: Take 81 mg by mouth every evening. Take after 12 weeks for prevention of preeclampsia later in pregnancy) 300 tablet 2   . calcium carbonate (TUMS - DOSED IN MG ELEMENTAL CALCIUM) 500 MG chewable tablet Chew 3 tablets by mouth daily as needed for indigestion or heartburn.     . Prenat w/o A-FeCbn-Meth-FA-DHA (PRENATE MINI) 29-0.6-0.4-350 MG CAPS Take 1 capsule by mouth daily before breakfast. (Patient taking differently: Take 1 capsule by mouth every evening. ) 90 capsule 3   . valACYclovir (VALTREX) 1000 MG tablet Take 1 tablet bid x 5 days prn, then take 1 tablet daily until after delivery of baby, for suppression. (Patient taking differently: Take 500 mg by mouth 2 (two) times daily. until after delivery of baby, for suppression.) 30 tablet 5   . amoxicillin-clavulanate (AUGMENTIN) 875-125 MG tablet Take 1 tablet by mouth 2 (two) times daily. (Patient not taking: Reported on 10/04/2019) 14 tablet 0 Completed Course at Unknown time    No Known Allergies  Review of Systems: Pertinent items noted in HPI and remainder of comprehensive ROS otherwise negative.  Physical Exam: BP 128/67 (BP Location: Right Arm)   Pulse 100   Temp 98.2 F (36.8 C) (Oral)   Resp 16   Ht  (1.676 m)   Wt 129.3 kg   LMP 01/08/2019   SpO2 99%   BMI 46.00 kg/m  FHR by Doppler: 162 bpm CONSTITUTIONAL: Well-developed, well-nourished female in no acute distress.  HENT:  Normocephalic, atraumatic, External right and left ear normal. Oropharynx is clear and moist EYES: Conjunctivae and EOM are normal. No scleral icterus.  NECK: Normal range of motion, supple, no masses SKIN: Skin is warm and dry. No rash noted. Not  diaphoretic. No erythema. No pallor. NEUROLGIC: Alert and oriented to person, place, and time. Normal reflexes, muscle tone coordination. No cranial nerve deficit noted. PSYCHIATRIC: Normal mood and affect. Normal behavior. Normal judgment and thought content. CARDIOVASCULAR: Normal heart rate noted RESPIRATORY: Effort and breath sounds normal, no problems with respiration noted ABDOMEN: Soft, nontender, nondistended, gravid. Well-healed Pfannenstiel incision. PELVIC: Deferred MUSCULOSKELETAL: Normal range of motion. No edema and no tenderness. 2+ distal pulses.   Pertinent Labs/Studies:   Results for orders placed or performed during the hospital encounter of 10/06/19 (from the past 72 hour(s))  SARS Coronavirus  2 by RT PCR (hospital order, performed in [facility] hospital lab) Nasopharyngeal Nasopharyngeal Swab     Status: None   Collection Time: 10/06/19  8:48 AM   Specimen: Nasopharyngeal Swab  Result Value Ref Range   SARS Coronavirus 2 NEGATIVE NEGATIVE    Comment: (NOTE) If result is NEGATIVE SARS-CoV-2 target nucleic acids are NOT DETECTED. The SARS-CoV-2 RNA is generally detectable in upper and lower  respiratory specimens during the acute phase of infection. The lowest  concentration of SARS-CoV-2 viral copies this assay can detect is 250  copies / mL. A negative result does not preclude SARS-CoV-2 infection  and should not be used as the sole basis for treatment or other  patient management decisions.  A negative result may occur with  improper specimen collection / handling, submission of specimen other  than nasopharyngeal swab, presence of viral mutation(s) within the  areas targeted by this assay, and inadequate number of viral copies  (<250 copies / mL). A negative result must be combined with clinical  observations, patient history, and epidemiological information. If result is POSITIVE SARS-CoV-2 target nucleic acids are DETECTED. The SARS-CoV-2 RNA is generally  detectable in upper and lower  respiratory specimens dur ing the acute phase of infection.  Positive  results are indicative of active infection with SARS-CoV-2.  Clinical  correlation with patient history and other diagnostic information is  necessary to determine patient infection status.  Positive results do  not rule out bacterial infection or co-infection with other viruses. If result is PRESUMPTIVE POSTIVE SARS-CoV-2 nucleic acids MAY BE PRESENT.   A presumptive positive result was obtained on the submitted specimen  and confirmed on repeat testing.  While 2019 novel coronavirus  (SARS-CoV-2) nucleic acids may be present in the submitted sample  additional confirmatory testing may be necessary for epidemiological  and / or clinical management purposes  to differentiate between  SARS-CoV-2 and other Sarbecovirus currently known to infect humans.  If clinically indicated additional testing with an alternate test  methodology [phone]) is advised. The SARS-CoV-2 RNA is generally  detectable in upper and lower respiratory sp ecimens during the acute  phase of infection. The expected result is Negative. Fact Sheet for Patients:  BoilerBrush.com.cy Fact Sheet for Healthcare Providers: https://pope.com/ This test is not yet approved or cleared by the [place] FDA and has been authorized for detection and/or diagnosis of SARS-CoV-2 by FDA under an Emergency Use Authorization (EUA).  This EUA will remain in effect (meaning this test can be used) for the duration of the COVID-19 declaration under Section 564(b)(1) of the Act, 21 U.S.C. section 360bbb-3(b)(1), unless the authorization is terminated or revoked sooner. Performed at [facility], [address], [place], [place] [address]   ABO/Rh     Status: None   Collection Time: 10/06/19  9:28 AM  Result Value Ref Range   ABO/RH(D)      A POS Performed at [facility], [address] [place], [place] [address]   CBC     Status: None   Collection Time: 10/06/19  9:38 AM  Result Value Ref Range   WBC 8.1 4.0 - 10.5 K/uL   RBC 4.61 3.87 - 5.11 MIL/uL   Hemoglobin 13.1 12.0 - 15.0 g/dL   HCT 10.0 71.2 - 19.7 %   MCV 85.0 80.0 - 100.0 fL   MCH 28.4 26.0 - 34.0 pg   MCHC 33.4 30.0 - 36.0 g/dL   RDW 58.8 32.5 -  15.5 %   Platelets 198 150 - 400 K/uL   nRBC 0.0 0.0 - 0.2 %    Comment: Performed at [facility], [place] [address], [place], [place] [address]  RPR     Status: None   Collection Time: 10/06/19  9:38 AM  Result Value Ref Range   RPR Ser Ql NON REACTIVE NON REACTIVE    Comment: Performed at [facility], [place] [address] [place], [place] [address]  Type and screen [place]     Status: None   Collection Time: 10/06/19  9:38 AM  Result Value Ref Range   ABO/RH(D) A POS    Antibody Screen NEG    Sample Expiration      10/09/2019,2359 Performed at [facility], [place] [address], [place], [place] [address]     Assessment and Plan: Brook Geraci is a 38 y.o. K4M0102 at [redacted]w[redacted]d being admitted for scheduled cesarean section. The risks of cesarean section discussed with the patient included but were not limited to: bleeding which may require transfusion or reoperation; infection which may require antibiotics; injury to bowel, bladder, ureters or other surrounding organs; injury to the fetus; need for additional procedures including hysterectomy in the event of a life-threatening hemorrhage; placental abnormalities with subsequent pregnancies, incisional problems, thromboembolic phenomenon and other postoperative/anesthesia complications. The patient concurred with the proposed plan, giving informed written consent for the procedure. Patient desires permanent sterilization.  Other reversible forms of contraception were discussed with patient; she declines all other modalities. Risks of procedure discussed with patient  including but not limited to: risk of regret, permanence of method, bleeding, infection, injury to surrounding organs and need for additional procedures.  Failure risk of about 1% with increased risk of ectopic gestation if pregnancy occurs was also discussed with patient.  Also discussed possibility of post-tubal pain syndrome. Patient verbalized understanding of these risks and wants to proceed with sterilization.  Written informed consent obtained. Patient has been NPO since last night she will remain NPO for procedure. Anesthesia and OR aware. Preoperative prophylactic antibiotics and SCDs ordered on call to the OR. To OR when ready.   Pregnancy Complications: Maternal obesity, HSV on Valtrex (outbreak > 1 month ago), GBS bacteriuria, 4 prior C-sections Contraception: BTL, paperwork signed 7/22  Barrington Ellison, MD [facility] Family Medicine Fellow, [facility] for [facility], [place]

## 2019-10-08 NOTE — Transfer of Care (Signed)
Immediate Anesthesia Transfer of Care Note  Patient: Jean Fox  Procedure(s) Performed: CESAREAN SECTION WITH BILATERAL TUBAL LIGATION (Bilateral )  Patient Location: PACU  Anesthesia Type:Spinal  Level of Consciousness: awake  Airway & Oxygen Therapy: Patient Spontanous Breathing  Post-op Assessment: Report given to RN  Post vital signs: Reviewed and stable  Last Vitals:  Vitals Value Taken Time  BP 104/75 10/08/19 1117  Temp    Pulse 73 10/08/19 1123  Resp 17 10/08/19 1123  SpO2 100 % 10/08/19 1123  Vitals shown include unvalidated device data.  Last Pain:  Vitals:   10/08/19 0817  TempSrc: Oral         Complications: No apparent anesthesia complications

## 2019-10-08 NOTE — Op Note (Addendum)
Jean Fox PROCEDURE DATE: 10/08/2019  PREOPERATIVE DIAGNOSES: Intrauterine pregnancy at [redacted]w[redacted]d weeks gestation; elective repeat; undesired fertility  POSTOPERATIVE DIAGNOSES: The same  PROCEDURE: Repeat Low Transverse Cesarean Section, Bilateral Salpingectomy SURGEON:  Dr. Tinnie Gens  ASSISTANT:  Jerilynn Birkenhead, MD An experienced assistant was required given the standard of surgical care given the complexity of the case.  This assistant was needed for exposure, dissection, suctioning, retraction, instrument exchange, assisting with delivery with administration of fundal pressure, and for overall help during the procedure.  ANESTHESIOLOGIST: Dr. Rosana Hoes  INDICATIONS: Jean Fox is a 38 y.o. [id number] at [redacted]w[redacted]d here for cesarean section and bilateral tubal sterilization secondary to the indications listed under preoperative diagnoses; please see preoperative note for further details.  The risks of surgery were discussed with the patient including but were not limited to: bleeding which may require transfusion or reoperation; infection which may require antibiotics; injury to bowel, bladder, ureters or other surrounding organs; injury to the fetus; need for additional procedures including hysterectomy in the event of a life-threatening hemorrhage; placental abnormalities wth subsequent pregnancies, incisional problems, thromboembolic phenomenon and other postoperative/anesthesia complications.  Patient also desires permanent sterilization.  Other reversible forms of contraception were discussed with patient; she declines all other modalities. Risks of procedure discussed with patient including but not limited to: risk of regret, permanence of method, bleeding, infection, injury to surrounding organs and need for additional procedures.  Patient was counseled regarding a prophylactic bilateral salpingectomy given that a growing body of knowledge reveals that the majority of cases of high grade  serous "ovarian" cancer actually are fallopian tube cancers.The precursor lesions are though to arise from the fimbriated end of the fallopian tube and the cancer can have metastases from this primary lesion; and removal of fallopian tubes do not result in any known hormonal imbalance.  Also discussed possibility of post-tubal pain syndrome. The patient concurred with the proposed plan, giving informed written consent for the procedures.    FINDINGS:  Viable female infant in cephalic presentation. Clear amniotic fluid.  Intact placenta, three vessel cord.  Normal uterus, fallopian tubes and ovaries bilaterally. Fallopian tubes were sterilized bilaterally. APGAR (1 MIN): 8 APGAR (5 MINS): 9  APGAR (10 MINS):    ANESTHESIA: Spinal INTRAVENOUS FLUIDS: 2300 ml ESTIMATED BLOOD LOSS: 639 ml URINE OUTPUT:  100 ml SPECIMENS: Placenta sent to L&D; bilateral fallopian tubes sent to pathology COMPLICATIONS: None immediate  PROCEDURE IN DETAIL:  The patient preoperatively received intravenous antibiotics and had sequential compression devices applied to her lower extremities.   She was then taken to the operating room where spinal anesthesia was administered and was found to be adequate. She was then placed in a dorsal supine position with a leftward tilt, and prepped and draped in a sterile manner.  A foley catheter was placed into her bladder and attached to constant gravity.  After an adequate timeout was performed, an infraumbilical vertical skin incision was made with the scapel. This incision was taken down to the fascia using electrocautery with care given to maintain good hemostasis.  The fascia was incised in the midline, and the fascial incision was then extended superiorly and inferiorly using electrocautery without difficulty. The rectus muscles were split bluntly in the midline, and the peritoneum entered sharply without complication. This peritoneal incision was then extended superiorly and  inferiorly with care given to prevent bowel or bladder injury. Scar tissue taken down with Mayo scissors. Attention was then turned to the lower uterine segment where a transverse hysterotomy  was made with a scalpel and extended bilaterally bluntly.  The infant was successfully delivered, the cord was clamped and cut and the infant was handed over to awaiting neonatology team. Uterine massage was then administered, and the placenta delivered intact with a three-vessel cord. The uterus was then cleared of clot and debris.  The hysterotomy was closed with 0 Vicryl in a running locked fashion. Attention was then turned to the fallopian tubes, The left then right fallopian tubes were clamped cut and excised. The pedicle was doubly suture ligated with 2-0 plain gut suture. Excellent hemostasis was noted. The pelvis was cleared of all clot and debris. Hemostasis was confirmed on all surfaces.  The retractor was removed. The fascia was then closed using 0 looped PDS in a running fashion.  The subcutaneous layer was irrigated, reapproximated with 2-0 plain gut interrupted stitches, and the skin was closed with staples. 30 mL 0.25% Marcaine injected around incision site. The patient tolerated the procedure well. Sponge, instrument and needle counts were correct x 3.  She was taken to the recovery room in stable condition.   Barrington Ellison, MD [facility] Family [facility] Fellow, [facility] for [employer], [place]

## 2019-10-08 NOTE — Anesthesia Postprocedure Evaluation (Signed)
Anesthesia Post Note  Patient: Jean Fox  Procedure(s) Performed: CESAREAN SECTION WITH BILATERAL TUBAL LIGATION (Bilateral )     Patient location during evaluation: PACU Anesthesia Type: Spinal Level of consciousness: awake and alert Pain management: pain level controlled Vital Signs Assessment: post-procedure vital signs reviewed and stable Respiratory status: spontaneous breathing and respiratory function stable Cardiovascular status: blood pressure returned to baseline and stable Postop Assessment: no headache, no backache, spinal receding and no apparent nausea or vomiting Anesthetic complications: no    Last Vitals:  Vitals:   10/08/19 1400 10/08/19 1500  BP: 121/62 116/62  Pulse: 74 76  Resp: 16 18  Temp: 37.1 C (!) 36.4 C  SpO2: 98% 100%    Last Pain:  Vitals:   10/08/19 1500  TempSrc: Axillary  PainSc: 0-No pain   Pain Goal: Patients Stated Pain Goal: 2 (10/08/19 1400)                 Montez Hageman

## 2019-10-08 NOTE — Discharge Summary (Addendum)
Postpartum Discharge Summary  Patient Name: Jean Fox DOB: 01-06-1981 MRN: [id number]  Date of admission: 10/08/2019 Delivering Provider: Donnamae Jude   Date of discharge: 10/10/2019  Admitting diagnosis: RCS Undesired Fertility Intrauterine pregnancy: [redacted]w[redacted]d    Secondary diagnosis:  Active Problems:   H/O cesarean section   Herpes genitalis in women   Obesity affecting pregnancy, antepartum   GBS bacteriuria   [redacted] weeks gestation of pregnancy   History of elective cesarean section  Additional problems: None     Discharge diagnosis: Term Pregnancy Delivered                                                                                                Post partum procedures:None  Augmentation: None  Complications: None  Hospital course:  Sceduled C/S   38y.o. yo [id number] admitted to the hospital 10/08/2019 for scheduled cesarean section with the following indication:Elective Repeat. Salpingectomy done with section. Staples placed and to be removed in 1 week; appt requested. Membrane Rupture Time/Date: 10:23 AM ,10/08/2019   Patient delivered a Viable infant.10/08/2019  Details of operation can be found in separate operative note.  Pateint had an uncomplicated postpartum course.  She is ambulating, tolerating a regular diet, passing flatus, and urinating well. Patient is discharged home in stable condition on  10/10/19        Delivery time: 10:23 AM    Magnesium Sulfate received: No BMZ received: No Rhophylac:No MMR:No Transfusion:No  Physical exam  Vitals:   10/09/19 0404 10/09/19 1436 10/09/19 2159 10/10/19 0540  BP: 125/66 (!) 119/58 129/71 124/68  Pulse: 81 82 71 68  Resp: '18 18 16 18  ' Temp: 97.7 F (36.5 C) 98.6 F (37 C) 98.1 F (36.7 C) 98.3 F (36.8 C)  TempSrc: Oral Oral Oral Oral  SpO2:  100% 100%   Weight:      Height:       General: alert, cooperative and no distress Lochia: appropriate Uterine Fundus: firm Incision:  Healing well with no significant drainage, No significant erythema, Dressing is clean, dry, and intact DVT Evaluation: No evidence of DVT seen on physical exam. Labs: Lab Results  Component Value Date   WBC 12.0 (H) 10/09/2019   HGB 11.4 (L) 10/09/2019   HCT 34.6 (L) 10/09/2019   MCV 88.0 10/09/2019   PLT 201 10/09/2019   CMP Latest Ref Rng & Units 10/08/2019  Creatinine 0.44 - 1.00 mg/dL 0.59    Discharge instruction: per After Visit Summary and "Baby and Me Booklet".  After visit meds:  Allergies as of 10/10/2019   No Known Allergies     Medication List    STOP taking these medications   amoxicillin-clavulanate 875-125 MG tablet Commonly known as: Augmentin   aspirin EC 81 MG tablet   calcium carbonate 500 MG chewable tablet Commonly known as: TUMS - dosed in mg elemental calcium   valACYclovir 1000 MG tablet Commonly known as: Valtrex     TAKE these medications   acetaminophen 325 MG tablet Commonly known as: TYLENOL Take 2 tablets (650 mg total) by mouth  every 6 (six) hours as needed for mild pain (temperature > 101.5.).   ibuprofen 600 MG tablet Commonly known as: ADVIL Take 1 tablet (600 mg total) by mouth every 6 (six) hours as needed for fever or headache.   Prenate Mini 29-0.6-0.4-350 MG Caps Take 1 capsule by mouth daily before breakfast. What changed: when to take this       Diet: routine diet  Activity: Advance as tolerated. Pelvic rest for 6 weeks.   Outpatient follow up:4 weeks Follow up Appt: Future Appointments  Date Time Provider [place]  10/15/2019  4:15 PM Woodroe Mode, MD [place] None  11/12/2019 10:00 AM Woodroe Mode, MD [facility] None   Follow up Visit:   Please schedule this patient for Postpartum visit in: 4 weeks with the following provider: Any provider For C/S patients schedule nurse incision check in weeks 2 weeks: yes High risk pregnancy complicated by: 4 prior sections Delivery mode:  CS Anticipated  Birth Control:  BTL PP Procedures needed: incision check and staples removed in 1 week  Schedule Integrated BH visit: no   Newborn Data: Live born female  Birth Weight: 8 lb 0.4 oz (3640 g) APGAR: 8, 9  Newborn Delivery   Birth date/time: 10/08/2019 10:23:00 Delivery type: C-Section, Low Vertical Trial of labor: No C-section categorization: Repeat      Baby Feeding: Bottle and Breast Disposition:home with mother   10/10/2019 Gifford Shave, MD   I spoke with and examined patient and agree with resident/PA-S/MS/SNM's note and plan of care.  Bulky dressing removed, vertical skin incision w/o s/s infection, honeycomb intact, has staples, to f/u as scheduled for removal/incision check Roma Schanz, CNM, [facility] 10/10/2019 8:29 AM

## 2019-10-08 NOTE — Anesthesia Procedure Notes (Signed)
Spinal  Patient location during procedure: OR Staffing Anesthesiologist: Angelika Jerrett, MD Performed: anesthesiologist  Preanesthetic Checklist Completed: patient identified, site marked, surgical consent, pre-op evaluation, timeout performed, IV checked, risks and benefits discussed and monitors and equipment checked Spinal Block Patient position: sitting Prep: DuraPrep Patient monitoring: heart rate, continuous pulse ox and blood pressure Approach: midline Location: L3-4 Injection technique: single-shot Needle Needle type: Sprotte  Needle gauge: 24 G Needle length: 9 cm Additional Notes Expiration date of kit checked and confirmed. Patient tolerated procedure well, without complications.       

## 2019-10-08 NOTE — Anesthesia Preprocedure Evaluation (Signed)
Anesthesia Evaluation  Patient identified by MRN, date of birth, ID band Patient awake    Reviewed: Allergy & Precautions, H&P , NPO status , Patient's Chart, lab work & pertinent test results  Airway Mallampati: III  TM Distance: >3 FB Neck ROM: full    Dental no notable dental hx.    Pulmonary neg pulmonary ROS,    Pulmonary exam normal breath sounds clear to auscultation       Cardiovascular negative cardio ROS Normal cardiovascular exam Rhythm:regular Rate:Normal     Neuro/Psych negative neurological ROS  negative psych ROS   GI/Hepatic negative GI ROS, Neg liver ROS,   Endo/Other  Morbid obesity  Renal/GU negative Renal ROS  negative genitourinary   Musculoskeletal   Abdominal (+) + obese,   Peds  Hematology negative hematology ROS (+)   Anesthesia Other Findings   Reproductive/Obstetrics (+) Pregnancy                             Anesthesia Physical  Anesthesia Plan  ASA: III  Anesthesia Plan: Spinal   Post-op Pain Management:    Induction:   PONV Risk Score and Plan: 2 and Ondansetron, Dexamethasone and Treatment may vary due to age  Airway Management Planned: Natural Airway  Additional Equipment:   Intra-op Plan:   Post-operative Plan:   Informed Consent: I have reviewed the patients History and Physical, chart, labs and discussed the procedure including the risks, benefits and alternatives for the proposed anesthesia with the patient or authorized representative who has indicated his/her understanding and acceptance.       Plan Discussed with: CRNA and Surgeon  Anesthesia Plan Comments:         Anesthesia Quick Evaluation

## 2019-10-09 LAB — CBC
HCT: 34.6 % — ABNORMAL LOW (ref 36.0–46.0)
Hemoglobin: 11.4 g/dL — ABNORMAL LOW (ref 12.0–15.0)
MCH: 29 pg (ref 26.0–34.0)
MCHC: 32.9 g/dL (ref 30.0–36.0)
MCV: 88 fL (ref 80.0–100.0)
Platelets: 201 10*3/uL (ref 150–400)
RBC: 3.93 MIL/uL (ref 3.87–5.11)
RDW: 15.3 % (ref 11.5–15.5)
WBC: 12 10*3/uL — ABNORMAL HIGH (ref 4.0–10.5)
nRBC: 0 % (ref 0.0–0.2)

## 2019-10-09 LAB — SURGICAL PATHOLOGY

## 2019-10-09 LAB — BIRTH TISSUE RECOVERY COLLECTION (PLACENTA DONATION)

## 2019-10-09 NOTE — Lactation Note (Signed)
This note was copied from a baby's chart. Lactation Consultation Note  Patient Name: Girl Maria Coin [id number] Date: 10/09/2019 Reason for consult: Follow-up assessment;Term  [id number] - 1636 - I followed up with Ms. Franchot Mimes. I noted formula bottles in the baby's bassinet. Mom states that baby Jarrett Soho cluster fed over night and she tried to give her a little just to make sure she was getting enough to eat. She reports that Jarrett Soho refused the formula and has not really had any to eat.  Baby Jarrett Soho just fed prior to my entry. She was cueing while in the room, and I observed Ms. Ratledge latch. She seemed proficient in sandwiching her breast and maintaining a compression. Baby had a decent suck on my gloved finger prior to latching, and Ms. Brigance had copious breast milk when she hand expressed.  Baby has had excellent I/O's.   I noted that baby stayed on the breast throughout the visit, with mom switching breasts halfway through. Ms. Inabinet states that she did take a good (2-3 hour) nap this am. Ms. Sandall feels that her milk volume has increased today from baby's feedings.  Baby's 4% weight loss at 18 (ish) hours seemed noteworthy, and I discussed this with Ms. Degrasse. Taking everything into account (good output, copious colostrum, baby latches readily), I encouraged her to continue to breast feed on demand. I also brought her a manual pump and a foley cup and encouraged her to provide any EBM this evening just to be on the safe side.  I discussed some things to look for to differentiate between cluster feeding and a hungry baby, and I encouraged Ms. Patane to call lactation tonight for assistance if there are any concerns.   PLAN: Breast feed on demand 8-12 times/day. Pre-pump breasts (her areolas are slightly edematous). Post pump via manual pump and feed back any EBM. Call lactation for support tonight based on baby's behavior (excessive feedings or excessively sleepy).  Maternal  Data Formula Feeding for Exclusion: No Has patient been taught Hand Expression?: Yes Does the patient have breastfeeding experience prior to this delivery?: Yes  Feeding Feeding Type: Breast Fed  LATCH Score Latch: Repeated attempts needed to sustain latch, nipple held in mouth throughout feeding, stimulation needed to elicit sucking reflex.  Audible Swallowing: A few with stimulation  Type of Nipple: Everted at rest and after stimulation  Comfort (Breast/Nipple): Soft / non-tender  Hold (Positioning): No assistance needed to correctly position infant at breast.  LATCH Score: 8  Interventions Interventions: Breast feeding basics reviewed;Hand express;Pre-pump if needed;Breast compression;Hand pump  Lactation Tools Discussed/Used Pump Review: Setup, frequency, and cleaning;Milk Storage Initiated by:: hl Date initiated:: 10/09/19   Consult Status Consult Status: Follow-up Date: 10/10/19 Follow-up type: In-patient    Lenore Manner 10/09/2019, 5:58 PM

## 2019-10-09 NOTE — Progress Notes (Signed)
POSTPARTUM PROGRESS NOTE  POD #1  Subjective:  Jean Fox is a 38 y.o. [id number] s/p rLTCS at [redacted]w[redacted]d.  She reports she doing well. No acute events overnight. She denies any problems with ambulating, voiding or po intake. Denies nausea or vomiting. She has passed flatus. Pain is well controlled.  Lochia is appropriate.  Objective: Blood pressure 125/66, pulse 81, temperature 97.7 F (36.5 C), temperature source Oral, resp. rate 18, height 5\' 6"  (1.676 m), weight 129.3 kg, last menstrual period 01/08/2019, SpO2 100 %, unknown if currently breastfeeding.  Physical Exam:  General: alert, cooperative and no distress Chest: no respiratory distress Heart:regular rate, distal pulses intact Abdomen: soft, nontender,  Uterine Fundus: firm, appropriately tender DVT Evaluation: No calf swelling or tenderness Extremities: no edema Skin: warm, dry; vertical incision clean/dry/intact w dressing in place  Recent Labs    10/06/19 0938 10/08/19 1322  HGB 13.1 13.0  HCT 39.2 38.1    Assessment/Plan: Jean Fox is a 38 y.o. [id number] s/p rLTCS at [redacted]w[redacted]d for history of prior CS x4.  POD#1 - Doing welll; pain well controlled. H/H pending  Routine postpartum care  OOB, ambulated  Lovenox for VTE prophylaxis  Plan for staple removal as outpatient in 1 week Contraception: s/p BTL and salpingectomy Feeding: breast  Dispo: Plan for discharge POD#3.   LOS: 1 day   Jean Coupe, MD/[facility] OB Fellow  10/09/2019, 8:31 AM

## 2019-10-09 NOTE — Lactation Note (Signed)
This note was copied from a baby's chart. Lactation Consultation Note Baby 15 hrs. Baby is BF when [place] entered rm.  Mom's 5th child. Oldest 27 youngest is 2 mom stated she BF all of her children around 14 months each but the last one she BF for 19 months. Mom stated this baby is BF good. Noted mom pulling on her breast tissue for latching. Mom states it works well. Mom stated her milk usually comes in on the 3rd day and she can her the baby gulping. Reviewed newborn behavior, STS, I&O, supply and demand. Mom states she is doing well and has no questions at this time. Encouraged to call for questions or concerns. Lactation brochure given.  Patient Name: Jean Fox [id number] Date: 10/09/2019 Reason for consult: Initial assessment   Maternal Data Does the patient have breastfeeding experience prior to this delivery?: Yes  Feeding Feeding Type: Breast Fed  LATCH Score Latch: Grasps breast easily, tongue down, lips flanged, rhythmical sucking.  Audible Swallowing: A few with stimulation  Type of Nipple: Everted at rest and after stimulation  Comfort (Breast/Nipple): Soft / non-tender  Hold (Positioning): No assistance needed to correctly position infant at breast.  LATCH Score: 9  Interventions Interventions: Breast feeding basics reviewed;Support pillows;Skin to skin;Breast massage  Lactation Tools Discussed/Used WIC Program: No   Consult Status Consult Status: Follow-up Date: 10/10/19 Follow-up type: In-patient    Starla Deller, [place] 10/09/2019, 1:32 AM

## 2019-10-10 MED ORDER — IBUPROFEN 600 MG PO TABS: 600.0000 mg | ORAL_TABLET | Freq: Four times a day (QID) | ORAL | 0 refills | Status: DC | PRN

## 2019-10-10 MED ORDER — ACETAMINOPHEN 325 MG PO TABS: 650.0000 mg | ORAL_TABLET | Freq: Four times a day (QID) | ORAL | Status: DC | PRN

## 2019-10-10 NOTE — Discharge Instructions (Signed)
Postpartum Care After Cesarean Delivery °This sheet gives you information about how to care for yourself from the time you deliver your baby to up to 6-12 weeks after delivery (postpartum period). Your health care provider may also give you more specific instructions. If you have problems or questions, contact your health care provider. °Follow these instructions at home: °Medicines °· Take over-the-counter and prescription medicines only as told by your health care provider. °· If you were prescribed an antibiotic medicine, take it as told by your health care provider. Do not stop taking the antibiotic even if you start to feel better. °· Ask your health care provider if the medicine prescribed to you: °? Requires you to avoid driving or using heavy machinery. °? Can cause constipation. You may need to take actions to prevent or treat constipation, such as: °§ Drink enough fluid to keep your urine pale yellow. °§ Take over-the-counter or prescription medicines. °§ Eat foods that are high in fiber, such as beans, whole grains, and fresh fruits and vegetables. °§ Limit foods that are high in fat and processed sugars, such as fried or sweet foods. °Activity °· Gradually return to your normal activities as told by your health care provider. °· Avoid activities that take a lot of effort and energy (are strenuous) until approved by your health care provider. Walking at a slow to moderate pace is usually safe. Ask your health care provider what activities are safe for you. °? Do not lift anything that is heavier than your baby or 10 lb (4.5 kg) as told by your health care provider. °? Do not vacuum, climb stairs, or drive a car for as long as told by your health care provider. °· If possible, have someone help you at home until you are able to do your usual activities yourself. °· Rest as much as possible. Try to rest or take naps while your baby is sleeping. °Vaginal bleeding °· It is normal to have vaginal bleeding  (lochia) after delivery. Wear a sanitary pad to absorb vaginal bleeding and discharge. °? During the first week after delivery, the amount and appearance of lochia is often similar to a menstrual period. °? Over the next few weeks, it will gradually decrease to a dry, yellow-brown discharge. °? For most women, lochia stops completely by 4-6 weeks after delivery. Vaginal bleeding can vary from woman to woman. °· Change your sanitary pads frequently. Watch for any changes in your flow, such as: °? A sudden increase in volume. °? A change in color. °? Large blood clots. °· If you pass a blood clot, save it and call your health care provider to discuss. Do not flush blood clots down the toilet before you get instructions from your health care provider. °· Do not use tampons or douches until your health care provider says this is safe. °· If you are not breastfeeding, your period should return 6-8 weeks after delivery. If you are breastfeeding, your period may return anytime between 8 weeks after delivery and the time that you stop breastfeeding. °Perineal care ° °· If your C-section (Cesarean section) was unplanned, and you were allowed to labor and push before delivery, you may have pain, swelling, and discomfort of the tissue between your vaginal opening and your anus (perineum). You may also have an incision in the tissue (episiotomy) or the tissue may have torn during delivery. Follow these instructions as told by your health care provider: °? Keep your perineum clean and dry as told by   your health care provider. Use medicated pads and pain-relieving sprays and creams as directed. °? If you have an episiotomy or vaginal tear, check the area every day for signs of infection. Check for: °§ Redness, swelling, or pain. °§ Fluid or blood. °§ Warmth. °§ Pus or a bad smell. °? You may be given a squirt bottle to use instead of wiping to clean the perineum area after you go to the bathroom. As you start healing, you may use  the squirt bottle before wiping yourself. Make sure to wipe gently. °? To relieve pain caused by an episiotomy, vaginal tear, or hemorrhoids, try taking a warm sitz bath 2-3 times a day. A sitz bath is a warm water bath that is taken while you are sitting down. The water should only come up to your hips and should cover your buttocks. °Breast care °· Within the first few days after delivery, your breasts may feel heavy, full, and uncomfortable (breast engorgement). You may also have milk leaking from your breasts. Your health care provider can suggest ways to help relieve breast discomfort. Breast engorgement should go away within a few days. °· If you are breastfeeding: °? Wear a bra that supports your breasts and fits you well. °? Keep your nipples clean and dry. Apply creams and ointments as told by your health care provider. °? You may need to use breast pads to absorb milk leakage. °? You may have uterine contractions every time you breastfeed for several weeks after delivery. Uterine contractions help your uterus return to its normal size. °? If you have any problems with breastfeeding, work with your health care provider or a lactation consultant. °· If you are not breastfeeding: °? Avoid touching your breasts as this can make your breasts produce more milk. °? Wear a well-fitting bra and use cold packs to help with swelling. °? Do not squeeze out (express) milk. This causes you to make more milk. °Intimacy and sexuality °· Ask your health care provider when you can engage in sexual activity. This may depend on your: °? Risk of infection. °? Healing rate. °? Comfort and desire to engage in sexual activity. °· You are able to get pregnant after delivery, even if you have not had your period. If desired, talk with your health care provider about methods of family planning or birth control (contraception). °Lifestyle °· Do not use any products that contain nicotine or tobacco, such as cigarettes, e-cigarettes,  and chewing tobacco. If you need help quitting, ask your health care provider. °· Do not drink alcohol, especially if you are breastfeeding. °Eating and drinking ° °· Drink enough fluid to keep your urine pale yellow. °· Eat high-fiber foods every day. These may help prevent or relieve constipation. High-fiber foods include: °? Whole grain cereals and breads. °? Brown rice. °? Beans. °? Fresh fruits and vegetables. °· Take your prenatal vitamins until your postpartum checkup or until your health care provider tells you it is okay to stop. °General instructions °· Keep all follow-up visits for you and your baby as told by your health care provider. Most women visit their health care provider for a postpartum checkup within the first 3-6 weeks after delivery. °Contact a health care provider if you: °· Feel unable to cope with the changes that a new baby brings to your life, and these feelings do not go away. °· Feel unusually sad or worried. °· Have breasts that are painful, hard, or turn red. °· Have a fever. °·   Have trouble holding urine or keeping urine from leaking. °· Have little or no interest in activities you used to enjoy. °· Have not breastfed at all and you have not had a menstrual period for 12 weeks after delivery. °· Have stopped breastfeeding and you have not had a menstrual period for 12 weeks after you stopped breastfeeding. °· Have questions about caring for yourself or your baby. °· Pass a blood clot from your vagina. °Get help right away if you: °· Have chest pain. °· Have difficulty breathing. °· Have sudden, severe leg pain. °· Have severe pain or cramping in your abdomen. °· Bleed from your vagina so much that you fill more than one sanitary pad in one hour. Bleeding should not be heavier than your heaviest period. °· Develop a severe headache. °· Faint. °· Have blurred vision or spots in your vision. °· Have a bad-smelling vaginal discharge. °· Have thoughts about hurting yourself or your  baby. °If you ever feel like you may hurt yourself or others, or have thoughts about taking your own life, get help right away. You can go to your nearest emergency department or call: °· Your local emergency services (911 in the [place] °· A suicide crisis helpline, such as the National Suicide Prevention Lifeline at 1-800-273-8255. This is open 24 hours a day. °Summary °· The period of time from when you deliver your baby to up to 6-12 weeks after delivery is called the postpartum period. °· Gradually return to your normal activities as told by your health care provider. °· Keep all follow-up visits for you and your baby as told by your health care provider. °This information is not intended to replace advice given to you by your health care provider. Make sure you discuss any questions you have with your health care provider. °Document Released: 12/03/2000 Document Revised: 07/26/2018 Document Reviewed: 07/26/2018 °Elsevier Patient Education © 2020 [employer] Inc. ° °

## 2019-10-10 NOTE — Lactation Note (Signed)
This note was copied from a baby's chart. Lactation Consultation Note  Patient Name: Jean Fox [id number] Date: 10/10/2019   Mom declines a visit before going home.Mom said infant just finished feeding & that nursing is going well. Mom has a hand pump for home & said that this is her 5th baby & she was seen by a lactation consultant last evening.  Matthias Hughs [facility] 10/10/2019, 9:43 AM

## 2019-10-12 ENCOUNTER — Other Ambulatory Visit: Payer: Self-pay

## 2019-10-12 ENCOUNTER — Encounter (HOSPITAL_COMMUNITY): Payer: Self-pay

## 2019-10-12 ENCOUNTER — Inpatient Hospital Stay (HOSPITAL_COMMUNITY)
Admission: AD | Admit: 2019-10-12 | Discharge: 2019-10-13 | Disposition: A | Discharge status: Home / Self Care | Payer: Medicaid Other | Attending: Obstetrics and Gynecology | Admitting: Obstetrics and Gynecology

## 2019-10-12 DIAGNOSIS — O99893 Other specified diseases and conditions complicating puerperium: Secondary | ICD-10-CM | POA: Insufficient documentation

## 2019-10-12 DIAGNOSIS — I10 Essential (primary) hypertension: Secondary | ICD-10-CM | POA: Insufficient documentation

## 2019-10-12 DIAGNOSIS — M7989 Other specified soft tissue disorders: Secondary | ICD-10-CM | POA: Diagnosis not present

## 2019-10-12 DIAGNOSIS — Z8249 Family history of ischemic heart disease and other diseases of the circulatory system: Secondary | ICD-10-CM | POA: Insufficient documentation

## 2019-10-12 DIAGNOSIS — J45909 Unspecified asthma, uncomplicated: Secondary | ICD-10-CM | POA: Diagnosis not present

## 2019-10-12 DIAGNOSIS — O165 Unspecified maternal hypertension, complicating the puerperium: Secondary | ICD-10-CM | POA: Diagnosis not present

## 2019-10-12 DIAGNOSIS — R030 Elevated blood-pressure reading, without diagnosis of hypertension: Secondary | ICD-10-CM

## 2019-10-12 LAB — URINALYSIS, ROUTINE W REFLEX MICROSCOPIC
Bilirubin Urine: NEGATIVE
Glucose, UA: NEGATIVE mg/dL
Ketones, ur: NEGATIVE mg/dL
Nitrite: NEGATIVE
Protein, ur: NEGATIVE mg/dL
Specific Gravity, Urine: 1.01 (ref 1.005–1.030)
pH: 5 (ref 5.0–8.0)

## 2019-10-12 MED ORDER — IBUPROFEN 800 MG PO TABS
800.0000 mg | ORAL_TABLET | Freq: Once | ORAL | Status: AC
  Administered 2019-10-12: 800 mg via ORAL
  Filled 2019-10-12: qty 1

## 2019-10-12 NOTE — MAU Note (Signed)
S/p C/S on 10/19. Pt here for swelling and tingling in ankles and feet that started about 1 hour ago. Started having a sharp pain on the left side of head and has had some fuzzy vision. Started feeling lightheaded and dizzy. Took her BP at home and her BP was 180/100. EMS called and BP was 154/98.

## 2019-10-12 NOTE — MAU Provider Note (Signed)
History     CSN: [id number]  Arrival date and time: 10/12/19 2155   First Provider Initiated Contact with Patient 10/12/19 2323      Chief Complaint  Patient presents with  . Postpartum Complications  . Hypertension  . swelling in hands and feet   Jean Fox is a 38 y.o. [id number] at 4 days postpartum who receives care at [facility].  She presents today for Postpartum Complications, Hypertension, and swelling in hands and feet.  She states she took a shower and started noticing tingling in her hands and feet.  She reports her toes started to turn white and she became lightheaded.  She states she took her blood pressures and it was "jumping all over the place."  She states it was ranging from 140s-180s/70s-100s.  She states she started having sharp pains on the left side of her head in the front.  She states she also has some right side occipital sharp pain, but all of these have subsided since arrival. She reports some high blood pressure, that subsided, in her second pregnancy.  She some light bleeding at the bottom of her incision, but states it is otherwise unremarkable.       OB History    Gravida  6   Para  5   Term  5   Preterm      AB  1   Living  5     SAB  1   TAB      Ectopic      Multiple  0   Live Births  5           Past Medical History:  Diagnosis Date  . Asthma   . HSV (herpes simplex virus) anogenital infection     Past Surgical History:  Procedure Laterality Date  . CESAREAN SECTION    . CESAREAN SECTION N/A 05/25/2017   Procedure: REPEAT CESAREAN SECTION;  Surgeon: Caren Macadam, MD;  Location: [place];  Service: Obstetrics;  Laterality: N/A;  PICO DRESSING  . CESAREAN SECTION WITH BILATERAL TUBAL LIGATION Bilateral 10/08/2019   Procedure: CESAREAN SECTION WITH BILATERAL TUBAL LIGATION;  Surgeon: Donnamae Jude, MD;  Location: [facility];  Service: Obstetrics;  Laterality: Bilateral;    Family History  Problem  Relation Age of Onset  . Hypertension Mother   . Asthma Mother   . Miscarriages / [place] Mother   . Varicose Veins Mother   . Hypertension Maternal Grandmother   . Arthritis Maternal Grandmother   . COPD Maternal Grandmother   . Miscarriages / Stillbirths Maternal Grandmother   . Varicose Veins Maternal Grandmother     Social History   Tobacco Use  . Smoking status: Never Smoker  . Smokeless tobacco: Never Used  Substance Use Topics  . Alcohol use: No  . Drug use: No    Allergies: No Known Allergies  Medications Prior to Admission  Medication Sig Dispense Refill Last Dose  . acetaminophen (TYLENOL) 325 MG tablet Take 2 tablets (650 mg total) by mouth every 6 (six) hours as needed for mild pain (temperature > 101.5.). (Patient taking differently: Take 1,000 mg by mouth every 6 (six) hours as needed for mild pain (temperature > 101.5.). )   10/12/2019 at Unknown time  . ibuprofen (ADVIL) 600 MG tablet Take 1 tablet (600 mg total) by mouth every 6 (six) hours as needed for fever or headache. 30 tablet 0 10/12/2019 at Unknown time  . Prenat w/o A-FeCbn-Meth-FA-DHA (PRENATE MINI) 29-0.6-0.4-350  MG CAPS Take 1 capsule by mouth daily before breakfast. (Patient taking differently: Take 1 capsule by mouth every evening. ) 90 capsule 3     Review of Systems  Constitutional: Negative for chills and fever.  Eyes: Negative for visual disturbance.  Respiratory: Negative for cough and shortness of breath.   Gastrointestinal: Negative for abdominal pain, constipation, diarrhea, nausea and vomiting.  Genitourinary: Positive for vaginal bleeding. Negative for difficulty urinating, dysuria and vaginal discharge.  Musculoskeletal: Positive for back pain (Tailbone d/t position).  Neurological: Positive for light-headedness (Not currently) and headaches (Not currently). Negative for dizziness.   Physical Exam   Blood pressure 125/67, pulse 76, temperature 98 F (36.7 C), temperature source  Oral, resp. rate 20, height 5\' 6"  (1.676 m), weight 126.1 kg, SpO2 100 %, unknown if currently breastfeeding. Vitals:   10/13/19 0031 10/13/19 0046 10/13/19 0100 10/13/19 0110  BP: 126/76 128/69 125/60   Pulse: 71 69 70   Resp:    18  Temp:    97.9 F (36.6 C)  TempSrc:    Oral  SpO2:      Weight:      Height:        Physical Exam  Constitutional: She is oriented to person, place, and time. No distress.  Obese  HENT:  Head: Normocephalic and atraumatic.  Eyes: Conjunctivae are normal.  Neck: Normal range of motion.  Cardiovascular: Normal rate, regular rhythm and normal heart sounds.  Respiratory: Effort normal and breath sounds normal.  GI: Soft. Bowel sounds are normal. There is abdominal tenderness (At incision area).  Musculoskeletal: Normal range of motion.        General: Edema (Non pitting in BLE) present.  Neurological: She is alert and oriented to person, place, and time.  Skin: Skin is warm and dry.  Vertical Abdominal Incision: HC Dressing Removed. Staples in place; No discharge, erythema, or active bleeding noted.  Appropriately tender, but patient tolerates cleaning without incident.   Psychiatric: She has a normal mood and affect. Her behavior is normal.    MAU Course  Procedures Results for orders placed or performed during the hospital encounter of 10/12/19 (from the past 24 hour(s))  Urinalysis, Routine w reflex microscopic     Status: Abnormal   Collection Time: 10/12/19 10:54 PM  Result Value Ref Range   Color, Urine YELLOW YELLOW   APPearance CLEAR CLEAR   Specific Gravity, Urine 1.010 1.005 - 1.030   pH 5.0 5.0 - 8.0   Glucose, UA NEGATIVE NEGATIVE mg/dL   Hgb urine dipstick LARGE (A) NEGATIVE   Bilirubin Urine NEGATIVE NEGATIVE   Ketones, ur NEGATIVE NEGATIVE mg/dL   Protein, ur NEGATIVE NEGATIVE mg/dL   Nitrite NEGATIVE NEGATIVE   Leukocytes,Ua TRACE (A) NEGATIVE   RBC / HPF 0-5 0 - 5 RBC/hpf   WBC, UA 0-5 0 - 5 WBC/hpf   Bacteria, UA RARE (A)  NONE SEEN   Squamous Epithelial / LPF 6-10 0 - 5   Mucus PRESENT   Protein / creatinine ratio, urine     Status: None   Collection Time: 10/12/19 11:30 PM  Result Value Ref Range   Creatinine, Urine 45.36 mg/dL   Total Protein, Urine <6 mg/dL   Protein Creatinine Ratio        0.00 - 0.15 mg/mg[Cre]  CBC     Status: Abnormal   Collection Time: 10/12/19 11:41 PM  Result Value Ref Range   WBC 8.3 4.0 - 10.5 K/uL   RBC 3.86 (L)  3.87 - 5.11 MIL/uL   Hemoglobin 11.0 (L) 12.0 - 15.0 g/dL   HCT 16.133.5 (L) 09.636.0 - 04.546.0 %   MCV 86.8 80.0 - 100.0 fL   MCH 28.5 26.0 - 34.0 pg   MCHC 32.8 30.0 - 36.0 g/dL   RDW 40.914.9 81.111.5 - 91.415.5 %   Platelets 236 150 - 400 K/uL   nRBC 0.0 0.0 - 0.2 %  Comprehensive metabolic panel     Status: Abnormal   Collection Time: 10/12/19 11:41 PM  Result Value Ref Range   Sodium 138 135 - 145 mmol/L   Potassium 4.0 3.5 - 5.1 mmol/L   Chloride 105 98 - 111 mmol/L   CO2 25 22 - 32 mmol/L   Glucose, Bld 95 70 - 99 mg/dL   BUN 12 6 - 20 mg/dL   Creatinine, Ser 7.820.67 0.44 - 1.00 mg/dL   Calcium 9.0 8.9 - 95.610.3 mg/dL   Total Protein 6.3 (L) 6.5 - 8.1 g/dL   Albumin 3.2 (L) 3.5 - 5.0 g/dL   AST 30 15 - 41 U/L   ALT 36 0 - 44 U/L   Alkaline Phosphatase 71 38 - 126 U/L   Total Bilirubin 0.3 0.3 - 1.2 mg/dL   GFR calc non Af Amer >60 >60 mL/min   GFR calc Af Amer >60 >60 mL/min   Anion gap 8 5 - 15    MDM Physical Exam Labs: CBC, CMP, PC Ratio Measure BPQ15 min Assessment and Plan  Elevated BP Postpartum State  -Exam findings discussed. -Reviewed POC to include labs and monitoring of bp. -Patient declines pain medication at current. -Will reassess.   Cherre RobinsJessica L Sircharles Holzheimer, MSN, CNM 10/12/2019, 11:23 PM   Reassessment (1:00 AM) Labs WNL Transient Elevated BP  -Patient informed of lab results. -Reviewed how to take blood pressure at home.  Instructed to wait at least 10 minutes after incidents (dizziness, lightheadedness, etc) before taking pressure. -Patient  questions if incision looks okay. -Honeycomb dressing removed which was saturated at posterior end.  -Incision care reviewed including: washing below and above incision, anticipated drainage, keeping area dry.  Patient given gauze to help with evaluating drainage color (should it occur) and keeping area dry.  -Instructed to take blood pressure cuff to appt on Monday for calibration with clinic cuff. -Reviewed PP symptoms of concern including HA, visual disturbances, fever, unrelieved pain, and vaginal bleeding. -No questions or concerns. -Encouraged to call or return to MAU if symptoms worsen or with the onset of new symptoms. -Discharged to home in stable condition.  Cherre RobinsJessica L Octave Montrose MSN, CNM Advanced Practice Provider, [facility]

## 2019-10-13 DIAGNOSIS — O165 Unspecified maternal hypertension, complicating the puerperium: Secondary | ICD-10-CM

## 2019-10-13 LAB — COMPREHENSIVE METABOLIC PANEL
ALT: 36 U/L (ref 0–44)
AST: 30 U/L (ref 15–41)
Albumin: 3.2 g/dL — ABNORMAL LOW (ref 3.5–5.0)
Alkaline Phosphatase: 71 U/L (ref 38–126)
Anion gap: 8 (ref 5–15)
BUN: 12 mg/dL (ref 6–20)
CO2: 25 mmol/L (ref 22–32)
Calcium: 9 mg/dL (ref 8.9–10.3)
Chloride: 105 mmol/L (ref 98–111)
Creatinine, Ser: 0.67 mg/dL (ref 0.44–1.00)
GFR calc Af Amer: >60 mL/min (ref >60)
GFR calc non Af Amer: >60 mL/min (ref >60)
Glucose, Bld: 95 mg/dL (ref 70–99)
Potassium: 4 mmol/L (ref 3.5–5.1)
Sodium: 138 mmol/L (ref 135–145)
Total Bilirubin: 0.3 mg/dL (ref 0.3–1.2)
Total Protein: 6.3 g/dL — ABNORMAL LOW (ref 6.5–8.1)

## 2019-10-13 LAB — PROTEIN / CREATININE RATIO, URINE
Creatinine, Urine: 45.36 mg/dL
Total Protein, Urine: <6 mg/dL

## 2019-10-13 LAB — CBC
HCT: 33.5 % — ABNORMAL LOW (ref 36.0–46.0)
Hemoglobin: 11 g/dL — ABNORMAL LOW (ref 12.0–15.0)
MCH: 28.5 pg (ref 26.0–34.0)
MCHC: 32.8 g/dL (ref 30.0–36.0)
MCV: 86.8 fL (ref 80.0–100.0)
Platelets: 236 10*3/uL (ref 150–400)
RBC: 3.86 MIL/uL — ABNORMAL LOW (ref 3.87–5.11)
RDW: 14.9 % (ref 11.5–15.5)
WBC: 8.3 10*3/uL (ref 4.0–10.5)
nRBC: 0 % (ref 0.0–0.2)

## 2019-10-13 NOTE — Discharge Instructions (Signed)
How to Take Your Blood Pressure Blood pressure is a measurement of how strongly your blood is pressing against the walls of your arteries. Arteries are blood vessels that carry blood from your heart throughout your body. Your health care provider takes your blood pressure at each office visit. You can also take your own blood pressure at home with a blood pressure machine. You may need to take your own blood pressure:  To confirm a diagnosis of high blood pressure (hypertension).  To monitor your blood pressure over time.  To make sure your blood pressure medicine is working. Supplies needed: To take your blood pressure, you will need a blood pressure machine. You can buy a blood pressure machine, or blood pressure monitor, at most drugstores or online. There are several types of home blood pressure monitors. When choosing one, consider the following:  Choose a monitor that has an arm cuff.  Choose a cuff that wraps snugly around your upper arm. You should be able to fit only one finger between your arm and the cuff.  Do not choose a monitor that measures your blood pressure from your wrist or finger. Your health care provider can suggest a reliable monitor that will meet your needs. How to prepare To get the most accurate reading, avoid the following for 30 minutes before you check your blood pressure:  Drinking caffeine.  Drinking alcohol.  Eating.  Smoking.  Exercising. Five minutes before you check your blood pressure:  Empty your bladder.  Sit quietly without talking in a dining chair, rather than in a soft couch or armchair. How to take your blood pressure To check your blood pressure, follow the instructions in the manual that came with your blood pressure monitor. If you have a digital blood pressure monitor, the instructions may be as follows: 1. Sit up straight. 2. Place your feet on the floor. Do not cross your ankles or legs. 3. Rest your left arm at the level of  your heart on a table or desk or on the arm of a chair. 4. Pull up your shirt sleeve. 5. Wrap the blood pressure cuff around the upper part of your left arm, 1 inch (2.5 cm) above your elbow. It is best to wrap the cuff around bare skin. 6. Fit the cuff snugly around your arm. You should be able to place only one finger between the cuff and your arm. 7. Position the cord inside the groove of your elbow. 8. Press the power button. 9. Sit quietly while the cuff inflates and deflates. 10. Read the digital reading on the monitor screen and write it down (record it). 11. Wait 2-3 minutes, then repeat the steps, starting at step 1. What does my blood pressure reading mean? A blood pressure reading consists of a higher number over a lower number. Ideally, your blood pressure should be below 120/80. The first ("top") number is called the systolic pressure. It is a measure of the pressure in your arteries as your heart beats. The second ("bottom") number is called the diastolic pressure. It is a measure of the pressure in your arteries as the heart relaxes. Blood pressure is classified into four stages. The following are the stages for adults who do not have a short-term serious illness or a chronic condition. Systolic pressure and diastolic pressure are measured in a unit called mm Hg. Normal  Systolic pressure: below 120.  Diastolic pressure: below 80. Elevated  Systolic pressure: 120-129.  Diastolic pressure: below 80. Hypertension stage   1  Systolic pressure: 130-139.  Diastolic pressure: 80-89. Hypertension stage 2  Systolic pressure: 140 or above.  Diastolic pressure: 90 or above. You can have prehypertension or hypertension even if only the systolic or only the diastolic number in your reading is higher than normal. Follow these instructions at home:  Check your blood pressure as often as recommended by your health care provider.  Take your monitor to the next appointment with your  health care provider to make sure: ? That you are using it correctly. ? That it provides accurate readings.  Be sure you understand what your goal blood pressure numbers are.  Tell your health care provider if you are having any side effects from blood pressure medicine. Contact a health care provider if:  Your blood pressure is consistently high. Get help right away if:  Your systolic blood pressure is higher than 180.  Your diastolic blood pressure is higher than 110. This information is not intended to replace advice given to you by your health care provider. Make sure you discuss any questions you have with your health care provider. Document Released: 05/14/2016 Document Revised: 11/18/2017 Document Reviewed: 05/14/2016 Elsevier Patient Education  2020 Elsevier Inc.  

## 2019-10-15 ENCOUNTER — Ambulatory Visit (INDEPENDENT_AMBULATORY_CARE_PROVIDER_SITE_OTHER): Payer: Medicaid Other | Admitting: Obstetrics & Gynecology

## 2019-10-15 ENCOUNTER — Other Ambulatory Visit: Payer: Self-pay

## 2019-10-15 ENCOUNTER — Encounter: Payer: Self-pay | Admitting: Obstetrics & Gynecology

## 2019-10-15 ENCOUNTER — Inpatient Hospital Stay (HOSPITAL_COMMUNITY)
Admission: AD | Admit: 2019-10-15 | Discharge: 2019-10-15 | Disposition: A | Discharge status: Home / Self Care | Payer: Medicaid Other | Attending: Obstetrics and Gynecology | Admitting: Obstetrics and Gynecology

## 2019-10-15 ENCOUNTER — Inpatient Hospital Stay (HOSPITAL_COMMUNITY): Admission: AD | Admit: 2019-10-15 | Payer: Medicaid Other | Source: Home / Self Care

## 2019-10-15 ENCOUNTER — Encounter (HOSPITAL_COMMUNITY): Payer: Self-pay

## 2019-10-15 DIAGNOSIS — Z825 Family history of asthma and other chronic lower respiratory diseases: Secondary | ICD-10-CM | POA: Insufficient documentation

## 2019-10-15 DIAGNOSIS — T148XXA Other injury of unspecified body region, initial encounter: Secondary | ICD-10-CM | POA: Diagnosis not present

## 2019-10-15 DIAGNOSIS — O900 Disruption of cesarean delivery wound: Secondary | ICD-10-CM | POA: Diagnosis not present

## 2019-10-15 DIAGNOSIS — Z8249 Family history of ischemic heart disease and other diseases of the circulatory system: Secondary | ICD-10-CM | POA: Insufficient documentation

## 2019-10-15 NOTE — Patient Instructions (Signed)

## 2019-10-15 NOTE — Discharge Instructions (Signed)
We have placed steri strips on your wound. Leave these strips on for 4-5 days. You can trim the edges if they start to curl under. When you are ready to remove the strips, get them wet so that they will peel off more easily.   While the strips are on avoid soaking in the tub. Don't lift anything heavier than baby and avoid sudden movements. Wear loose comfortable clothing.   Some clear to blood tinged thin liquid drainage is normal. If you see pustular thick yellow-green drainage with a foul odor this can be a sign of infection. You should be evaluated if you see this type of drainage, if the wound reopens despite the steri strips, if you have spreading redness on the surrounding skin, increased pain at the incision, fevers, or vomiting as these can be other signs of infection or problems with wound healing.

## 2019-10-15 NOTE — MAU Provider Note (Signed)
History     CSN: [id number]  Arrival date and time: 10/15/19 1733   None     Chief Complaint  Patient presents with  . Wound Check   HPI   Jean Fox is 38 y.o. [id number] female POD #7 from [facility]  who presents for concern about wound complication. Patient had vertical skin incision closed with staples. Staples were removed this afternoon at clinic. Within 30 minutes of leaving clinic, she noticed fluid on her pants. This occurred while she was waiting as passenger in the car at the grocery store parking lot. Reports that wound had opened up in two spots. Drainage was clear with blood tinge. Denies erythema or warmth of the skin. Denies fevers and vomiting. Has not lifted anything heavier than newborn and has tried to avoid big movements or clothes that rub on the area.   OB History    Gravida  6   Para  5   Term  5   Preterm      AB  1   Living  5     SAB  1   TAB      Ectopic      Multiple  0   Live Births  5           Past Medical History:  Diagnosis Date  . Asthma   . HSV (herpes simplex virus) anogenital infection     Past Surgical History:  Procedure Laterality Date  . CESAREAN SECTION    . CESAREAN SECTION N/A 05/25/2017   Procedure: REPEAT CESAREAN SECTION;  Surgeon: Federico Flake, MD;  Location: [facility] BIRTHING SUITES;  Service: Obstetrics;  Laterality: N/A;  PICO DRESSING  . CESAREAN SECTION WITH BILATERAL TUBAL LIGATION Bilateral 10/08/2019   Procedure: CESAREAN SECTION WITH BILATERAL TUBAL LIGATION;  Surgeon: Reva Bores, MD;  Location: [facility] LD ORS;  Service: Obstetrics;  Laterality: Bilateral;    Family History  Problem Relation Age of Onset  . Hypertension Mother   . Asthma Mother   . Miscarriages / [place] Mother   . Varicose Veins Mother   . Hypertension Maternal Grandmother   . Arthritis Maternal Grandmother   . COPD Maternal Grandmother   . Miscarriages / Stillbirths Maternal Grandmother   . Varicose Veins Maternal  Grandmother     Social History   Tobacco Use  . Smoking status: Never Smoker  . Smokeless tobacco: Never Used  Substance Use Topics  . Alcohol use: No  . Drug use: No    Allergies: No Known Allergies  Medications Prior to Admission  Medication Sig Dispense Refill Last Dose  . acetaminophen (TYLENOL) 325 MG tablet Take 2 tablets (650 mg total) by mouth every 6 (six) hours as needed for mild pain (temperature > 101.5.). (Patient taking differently: Take 1,000 mg by mouth every 6 (six) hours as needed for mild pain (temperature > 101.5.). )   10/15/2019 at Unknown time  . ibuprofen (ADVIL) 600 MG tablet Take 1 tablet (600 mg total) by mouth every 6 (six) hours as needed for fever or headache. 30 tablet 0 10/15/2019 at Unknown time  . Prenat w/o A-FeCbn-Meth-FA-DHA (PRENATE MINI) 29-0.6-0.4-350 MG CAPS Take 1 capsule by mouth daily before breakfast. (Patient taking differently: Take 1 capsule by mouth every evening. ) 90 capsule 3 10/15/2019 at Unknown time  . valACYclovir (VALTREX) 1000 MG tablet Take 500 mg by mouth 2 (two) times daily.   10/15/2019 at Unknown time    Review of Systems  Constitutional: Negative  for appetite change, chills, fatigue and fever.  Respiratory: Negative for cough and shortness of breath.   Cardiovascular: Negative for chest pain.  Gastrointestinal: Negative for abdominal pain, diarrhea, nausea and vomiting.  Genitourinary: Negative for dysuria.  Musculoskeletal: Negative for myalgias.  Skin: Positive for wound. Negative for color change and rash.  Neurological: Negative for dizziness and light-headedness.   Physical Exam   Blood pressure 126/66, pulse 65, temperature 98.1 F (36.7 C), temperature source Oral, resp. rate 18, currently breastfeeding.  Physical Exam  Constitutional: She appears well-developed and well-nourished. No distress.  HENT:  Head: Normocephalic and atraumatic.  Eyes: Conjunctivae and EOM are normal.  Neck: Normal range of  motion. Neck supple.  Cardiovascular: Normal rate, regular rhythm and normal heart sounds.  Respiratory: Effort normal and breath sounds normal. No respiratory distress.  GI: Soft. She exhibits no distension. There is no abdominal tenderness. There is no rebound and no guarding.  Skin: Skin is warm and dry. She is not diaphoretic.  Small area of wound dehiscence in the middle of vertical skin incision with serosanguinous drainage present. No pus noted. No erythema or increased warmth. Wound is not deep and has good skin approximation.       MAU Course  Procedures  MDM Exam performed. Steri strips and honeycomb dressing applied.   Assessment and Plan   1. Postoperative complication of cesarean section   2. Skin wound from surgical incision     Mild wound separation after staple removal. No signs of infection. Wound is shallow and has good skin approximation. Steri strips placed. When patient stood had mild re-separation of subcuticular tissue due to redistribution of pannus. Honeycomb placed for further reinforcement and absorption of minimal amount of drainage present. Patient instructed to leave honeycomb on for the next 2-3 days and steri strips on for the next 4-5 days if possible. Avoidance of soaking the wound, lifting anything heavier than baby, sudden movements, restrictive clothing. Patient to call [facility] for reevaluation of the wound in one week. Infection and poor wound healing precautions reviewed.   [name] 10/15/2019, 6:52 PM

## 2019-10-15 NOTE — MAU Note (Signed)
Provide applied steri-strips and honeycomb dressing.

## 2019-10-15 NOTE — Progress Notes (Signed)
Subjective:     Dhani Imel is a 38 y.o. female who presents to the clinic 1 weeks status post repeat cesarean section . Eating a regular diet without difficulty. Bowel movements are normal. Pain is controlled with current analgesics. Medications being used: ibuprofen (OTC) and narcotic analgesics including Percocet.  The following portions of the patient's history were reviewed and updated as appropriate: current medications, past family history, past medical history, past social history, past surgical history and problem list.  Review of Systems Pertinent items are noted in HPI.    Objective:    BP 137/85   Pulse 69   Ht 5\' 6"  (1.676 m)   Wt 271 lb 3.2 oz (123 kg)   Breastfeeding Yes   BMI 43.77 kg/m  General:  alert, cooperative and no distress  Abdomen: soft, non-tender  Incision:   healing well, no drainage, no erythema, no hernia, no seroma, no swelling, no dehiscence, incision well approximated    staples removed Assessment:    Doing well postoperatively. Operative findings again reviewed. Pathology report discussed.    Plan:    1. Continue any current medications. 2. Wound care discussed. 3. Activity restrictions: no bending, stooping, or squatting and no lifting more than 15 pounds 4. Anticipated return to work: 6-8 week PP. 5. Follow up: 4 weeks   Woodroe Mode, MD 10/15/2019

## 2019-10-15 NOTE — Progress Notes (Signed)
Pt presents for incision check and staple removal. Pt was evaluated at [facility] for elevated bp reading at home. Pt denies having headache or swelling. Pt has no concerns with her incision.

## 2019-10-15 NOTE — MAU Note (Signed)
Pt G6P5, s/p repeat c-section on 10/19. Had vertical incision on outside skin. Was at office for staple removal today..  Within 30 mins of leaving office, she noticed fluid on her pants and noticed wound had dehised.

## 2019-10-22 ENCOUNTER — Other Ambulatory Visit: Payer: Self-pay

## 2019-10-22 ENCOUNTER — Ambulatory Visit (INDEPENDENT_AMBULATORY_CARE_PROVIDER_SITE_OTHER): Payer: Medicaid Other | Admitting: *Deleted

## 2019-10-22 NOTE — Progress Notes (Signed)
Subjective:     Jean Fox is a 38 y.o. female who presents to the clinic 2 weeks status post repeat C/S for [redacted] week gestation. Eating a regular diet without difficulty. Bowel movements are normal. Pain is controlled with current analgesics. Medications being used: ibuprofen (OTC).   Objective:    There were no vitals taken for this visit. General:  alert and cooperative  Abdomen: soft, no abnormal masses  Incision:   healing well, no drainage, no erythema, no hernia, no seroma, no swelling, no dehiscence, incision well approximated     Assessment: Dr Elly Modena in to assess incision.    Doing well postoperatively.   Plan:    1. Continue any current medications. 2. Wound care discussed. 3. Follow  Up for PP visit.    Lewie Loron [place], [place]

## 2019-10-22 NOTE — Progress Notes (Signed)
Patient seen and assessed by nursing staff during this encounter. I have reviewed the chart and agree with the documentation and plan.  Incision examined: no erythema, induration or drainage. Incision is healing well  Mora Bellman, MD 10/22/2019 4:03 PM

## 2019-11-12 ENCOUNTER — Other Ambulatory Visit: Payer: Self-pay

## 2019-11-12 ENCOUNTER — Telehealth (INDEPENDENT_AMBULATORY_CARE_PROVIDER_SITE_OTHER): Payer: Medicaid Other | Admitting: Obstetrics & Gynecology

## 2019-11-12 DIAGNOSIS — Z98891 History of uterine scar from previous surgery: Secondary | ICD-10-CM

## 2019-11-12 NOTE — Patient Instructions (Signed)

## 2019-11-12 NOTE — Progress Notes (Signed)
TELEHEALTH POSTPARTUM VIRTUAL VIDEO VISIT ENCOUNTER NOTE   Provider location: [facility] [place]   I connected with Chasady Lodato on 11/12/19 at 10:00 AM EST by MyChart Video Encounter at home and verified that I am speaking with the correct person using two identifiers.    I discussed the limitations, risks, security and privacy concerns of performing an evaluation and management service virtually and the availability of in person appointments. I also discussed with the patient that there may be a patient responsible charge related to this service. The patient expressed understanding and agreed to proceed.  Chief Complaint: Postpartum Visit  History of Present Illness: Leanne Sisler is a 38 y.o. Caucasian [id number] being evaluated for postpartum followup.    She is s/p repeat cesarean section on 10/08/19 at 39 weeks; she was discharged to home on 10/10/2019. Pregnancy complicated by fetal intolerance of labor. Baby is doing well.  Complains of occasional anxiety  Vaginal bleeding or discharge: No  Intercourse: No  Contraception: bilateral tubal ligation Mode of feeding infant: Breast PP depression s/s: No .  Any bowel or bladder issues: No  Pap smear: no abnormalities (date: 03/22/2019)  Review of Systems: Positive for . Her 12 point review of systems is negative or as noted in the History of Present Illness.  Patient Active Problem List   Diagnosis Date Noted  . History of elective cesarean section 10/08/2019  . Family history of carrier of hereditary disease 12/02/2016  . H/O cesarean section 11/16/2016  . Herpes genitalis in women 11/16/2016    Medications Kenzlei Tuft had no medications administered during this visit. Current Outpatient Medications  Medication Sig Dispense Refill  . acetaminophen (TYLENOL) 325 MG tablet Take 2 tablets (650 mg total) by mouth every 6 (six) hours as needed for mild pain (temperature > 101.5.). (Patient taking  differently: Take 1,000 mg by mouth every 6 (six) hours as needed for mild pain (temperature > 101.5.). )    . ibuprofen (ADVIL) 600 MG tablet Take 1 tablet (600 mg total) by mouth every 6 (six) hours as needed for fever or headache. 30 tablet 0  . Prenat w/o A-FeCbn-Meth-FA-DHA (PRENATE MINI) 29-0.6-0.4-350 MG CAPS Take 1 capsule by mouth daily before breakfast. (Patient taking differently: Take 1 capsule by mouth every evening. ) 90 capsule 3  . valACYclovir (VALTREX) 1000 MG tablet Take 500 mg by mouth 2 (two) times daily.     No current facility-administered medications for this visit.     Allergies Patient has no known allergies.  Physical Exam:  There were no vitals taken for this visit.  General:  Alert, oriented and cooperative. Patient is in no acute distress.  Mental Status: Normal mood and affect. Normal behavior. Normal judgment and thought content.   Respiratory: Normal respiratory effort noted, no problems with respiration noted  Rest of physical exam deferred due to type of encounter  PP Depression Screening:   Edinburgh Postnatal Depression Scale Screening Tool 10/15/2019 10/08/2019 05/27/2017  I have been able to laugh and see the funny side of things. 0 0 0  I have looked forward with enjoyment to things. 0 0 0  I have blamed myself unnecessarily when things went wrong. 0 0 0  I have been anxious or worried for no good reason. 0 0 0  I have felt scared or panicky for no good reason. 0 0 0  Things have been getting on top of me. 0 0 0  I have been so  unhappy that I have had difficulty sleeping. 0 0 0  I have felt sad or miserable. 0 0 0  I have been so unhappy that I have been crying. 0 0 0  The thought of harming myself has occurred to me. 0 0 0  Edinburgh Postnatal Depression Scale Total 0 0 0     Assessment:Patient is a 38 y.o. [id number] who is 6 weeks postpartum from a primary cesarean section.  She is doing well.   Plan: routine PP care There are no diagnoses  linked to this encounter.  RTC prn  I discussed the assessment and treatment plan with the patient. The patient was provided an opportunity to ask questions and all were answered. The patient agreed with the plan and demonstrated an understanding of the instructions.   The patient was advised to call back or seek an in-person evaluation/go to the ED for any concerning postpartum symptoms.  I provided 10 minutes of face-to-face time during this encounter.   Frutoso Chase, RN [facility] [employer] [employer] [facility]

## 2021-05-31 IMAGING — US US MFM OB FOLLOW UP
1 series · 13 of 28 positions shown · non-contrast
Comparison: none

[Series 1: us mfm ob follow up · 13 of 49 slices shown]
[im 2/49]
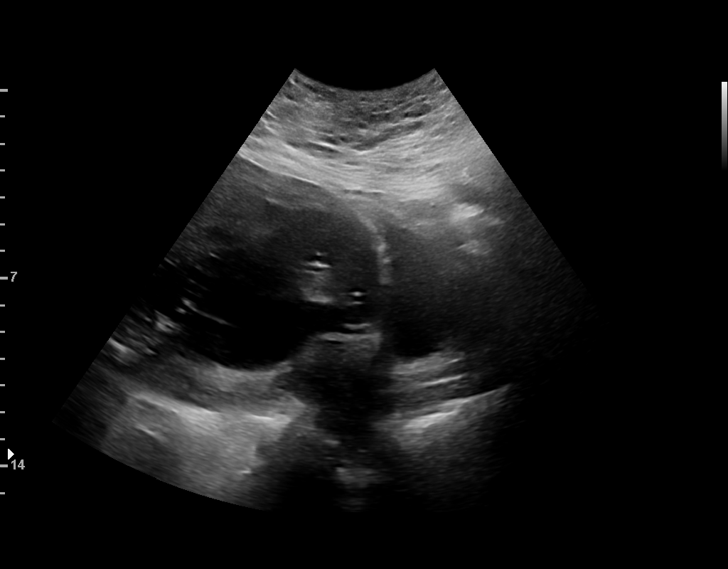
[im 6/49]
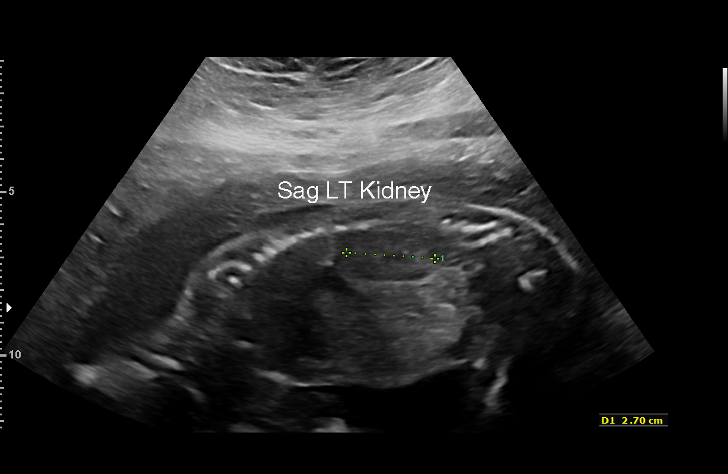
[im 9/49]
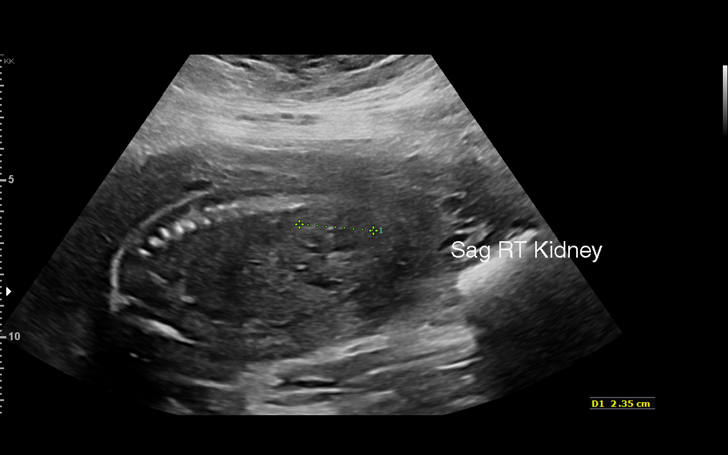
[im 13/49]
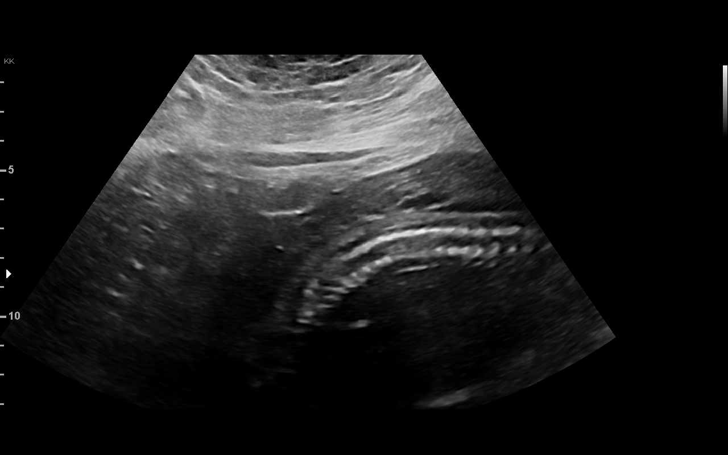
[im 17/49]
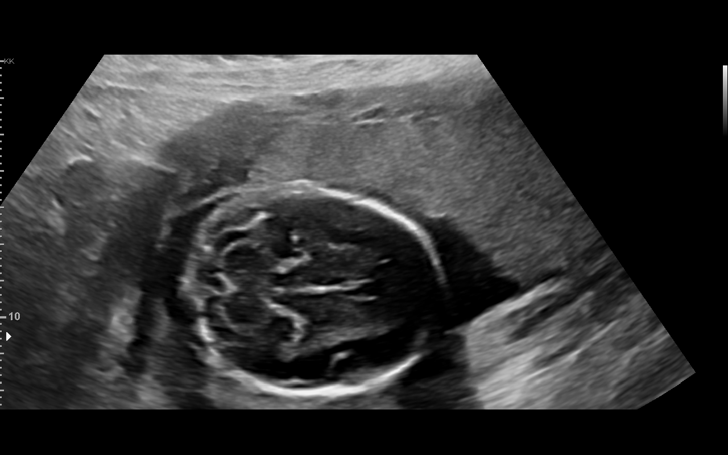
[im 20/49]
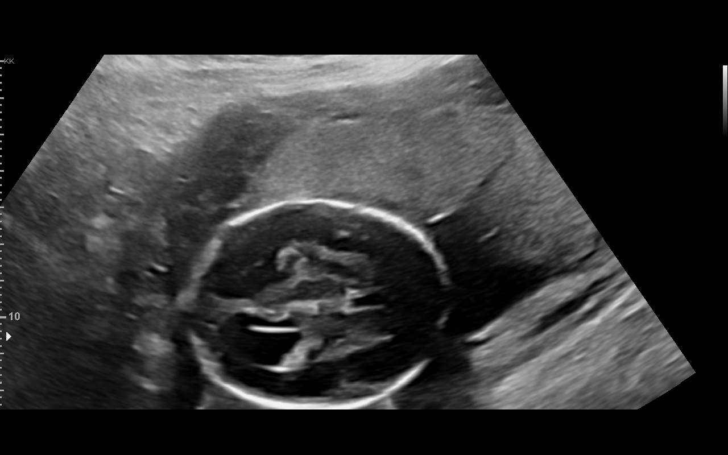
[im 25/49]
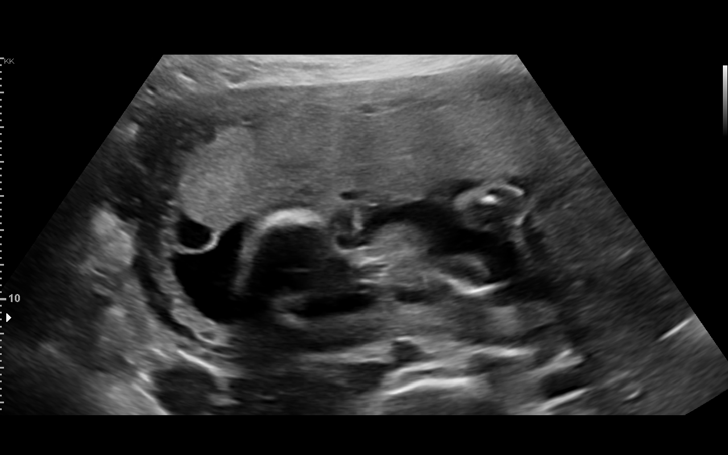
[im 29/49]
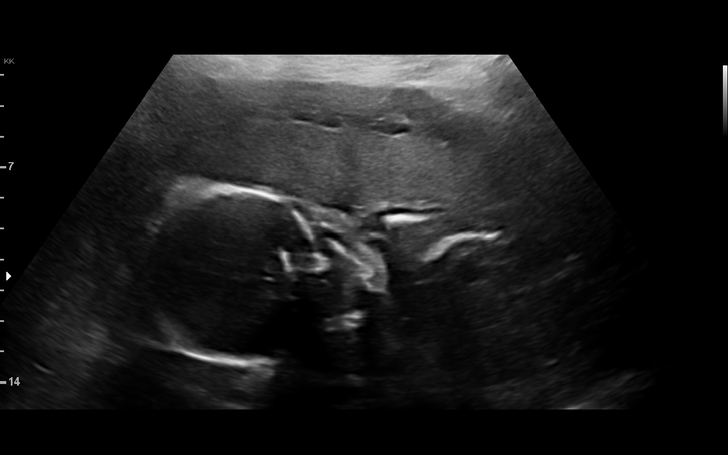
[im 33/49]
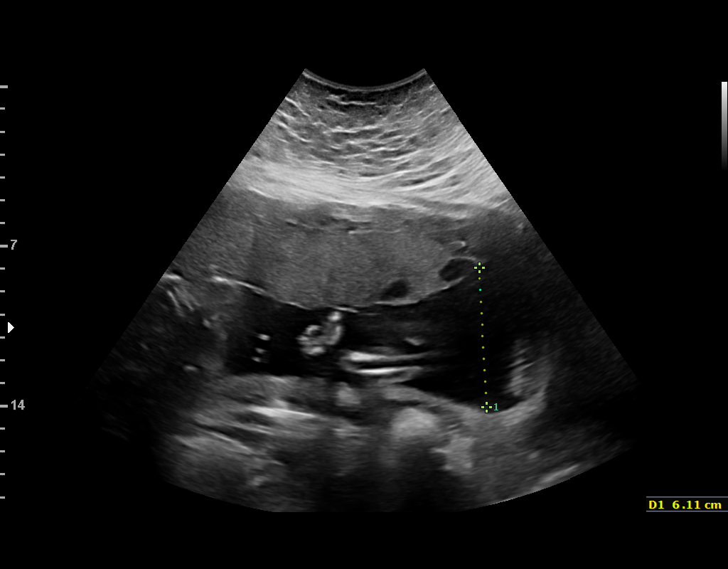
[im 36/49]
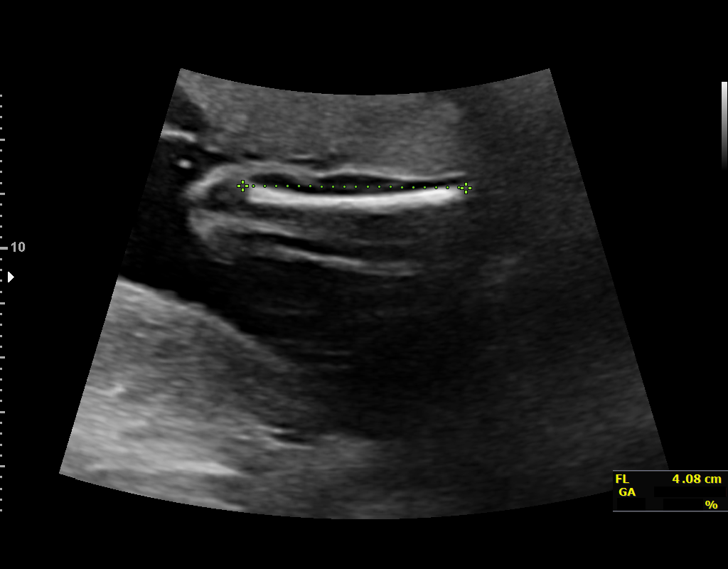
[im 40/49]
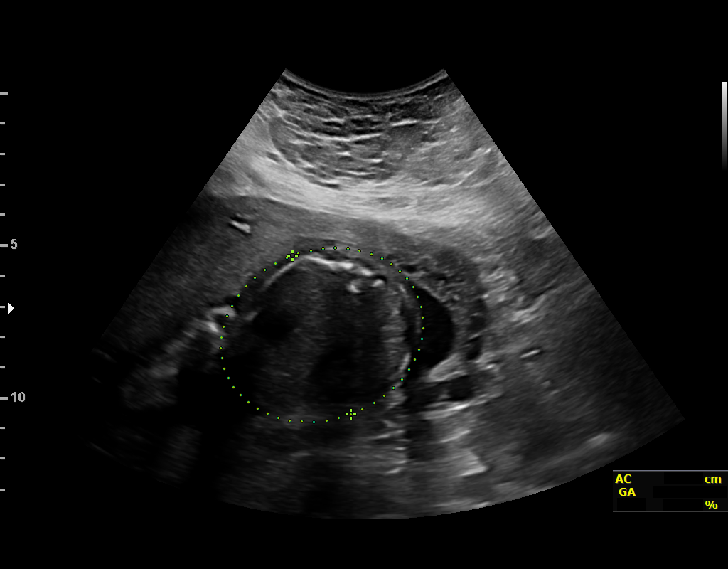
[im 43/49]
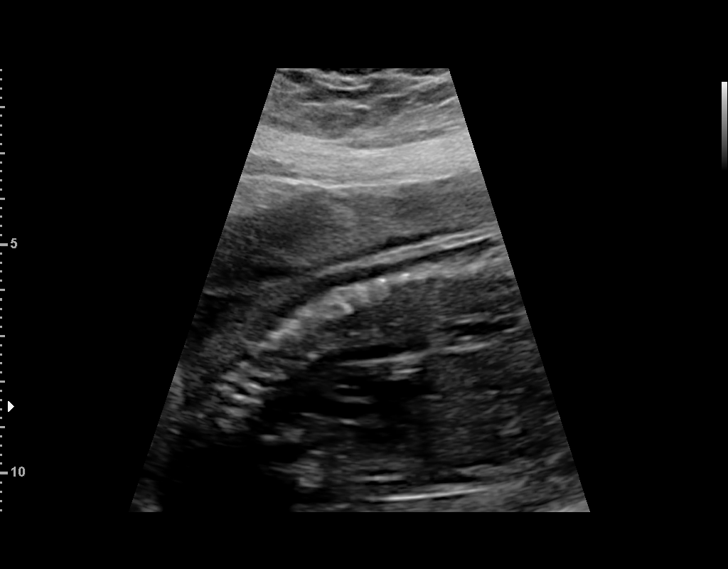
[im 47/49]
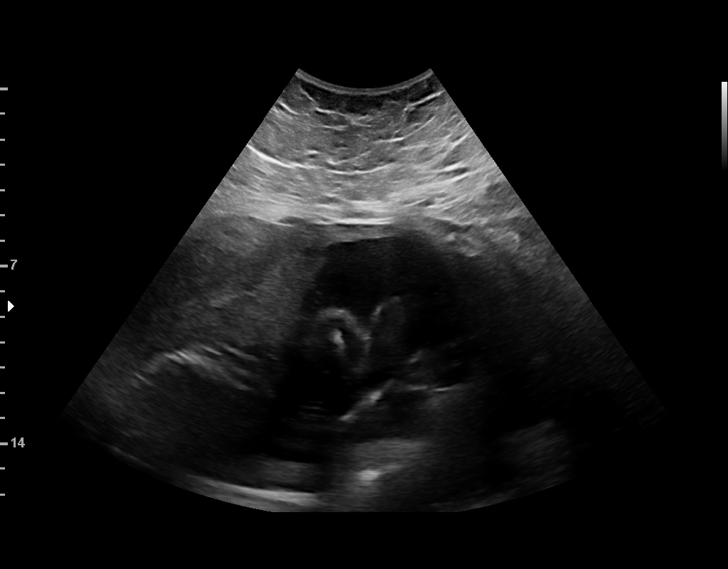

[13 of 28 positions shown; findings below may reference images not displayed]

----------------------------------------------------------------------

 ----------------------------------------------------------------------
Indications

  Advanced maternal age multigravida 35+,
  second trimester (Low Risk NIPS)
  Obesity complicating pregnancy, second
  trimester (47)
  Previous cesarean delivery, antepartum x 4
  23 weeks gestation of pregnancy
  Herpes simplex virus ([facility])
  Antenatal follow-up for nonvisualized fetal
  anatomy
 ----------------------------------------------------------------------
Vital Signs

 BMI:
Fetal Evaluation

 Num Of Fetuses:          1
 Fetal Heart Rate(bpm):   140
 Cardiac Activity:        Observed
 Presentation:            Transverse, head to maternal right
 Placenta:                Anterior
 P. Cord Insertion:       Previously Visualized

 Amniotic Fluid
 AFI FV:      Within normal limits

                             Largest Pocket(cm)

Biometry

 BPD:      56.2  mm     G. Age:  23w 1d         46  %    CI:        70.89   %    70 - 86
                                                         FL/HC:       19.8  %    19.2 -
 HC:      212.7  mm     G. Age:  23w 2d         42  %    HC/AC:       1.12       1.05 -
 AC:      189.1  mm     G. Age:  23w 5d         59  %    FL/BPD:      74.9  %    71 - 87
 FL:       42.1  mm     G. Age:  23w 5d         58  %    FL/AC:       22.3  %    20 - 24

 Est. FW:     613   gm     1 lb 6 oz     67  %
OB History

 Gravidity:    6         Term:   4        Prem:   0        SAB:   1
 TOP:          0       Ectopic:  0        Living: 4
Gestational Age

 LMP:           23w 1d        Date:  01/08/19                 EDD:   10/15/19
 U/S Today:     23w 3d                                        EDD:   10/13/19
 Best:          23w 1d     Det. By:  LMP  (01/08/19)          EDD:   10/15/19
Anatomy

 Cranium:               Appears normal         Aortic Arch:            Appears normal
 Cavum:                 Appears normal         Ductal Arch:            Previously seen
 Ventricles:            Appears normal         Diaphragm:              Previously seen
 Choroid Plexus:        Previously seen        Stomach:                Appears normal, left
                                                                       sided
 Cerebellum:            Appears normal         Abdomen:                Appears normal
 Posterior Fossa:       Previously seen        Abdominal Wall:         Previously seen
 Nuchal Fold:           Previously seen        Cord Vessels:           Previously seen
 Face:                  Orbits and profile     Kidneys:                Appear normal
                        previously seen
 Lips:                  Not well visualized    Bladder:                Appears normal
 Thoracic:              Appears normal         Spine:                  Previously seen
 Heart:                 Not well visualized    Upper Extremities:      Previously seen
 RVOT:                  Not well visualized    Lower Extremities:      Previously seen
 LVOT:                  Previously seen

 Other:  Heels previously visualized. Technically difficult due to maternal
         habitus and fetal position.
Cervix Uterus Adnexa

 Cervix
 Length:            3.3  cm.
 Normal appearance by transabdominal scan.
Impression

 Normal interval growth.
 Suboptimal views of the fetal anatomy again seen
Recommendations

 Follow up anatomy and growth in 4 weeks.

## 2021-07-26 IMAGING — US US MFM OB FOLLOW UP
1 series · 13 of 28 positions shown · non-contrast
Comparison: none

[Series 1: us mfm ob follow up · 13 of 58 slices shown]
[im 3/58]
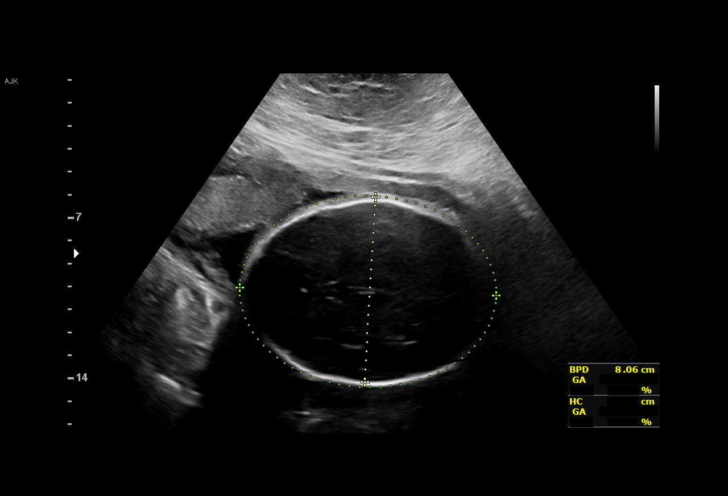
[im 7/58]
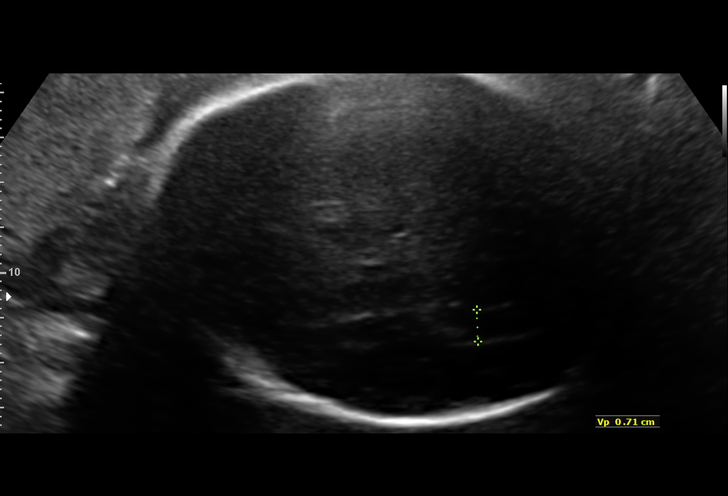
[im 11/58]
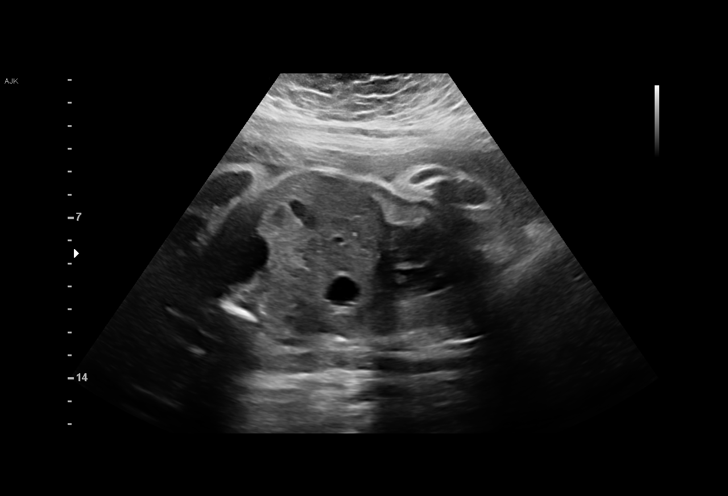
[im 15/58]
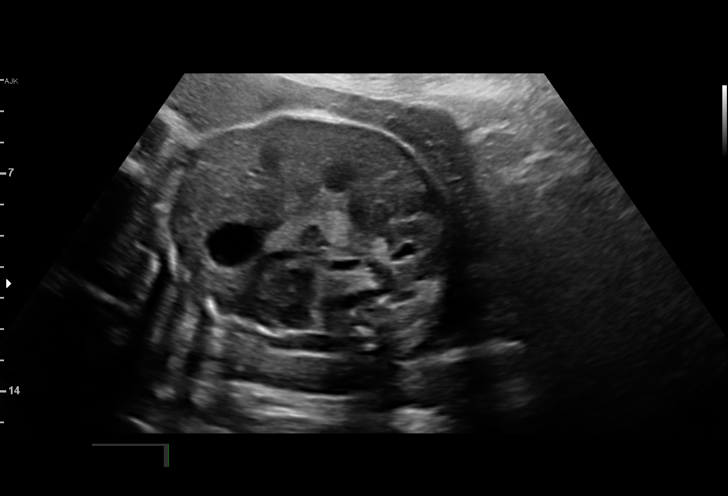
[im 20/58]
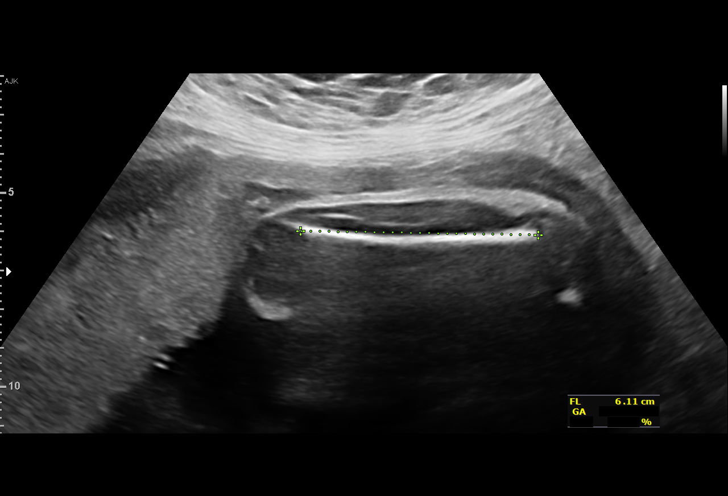
[im 24/58]
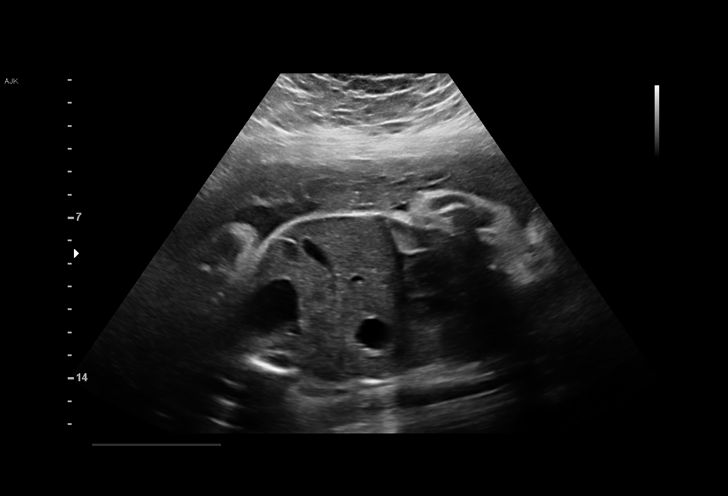
[im 30/58]
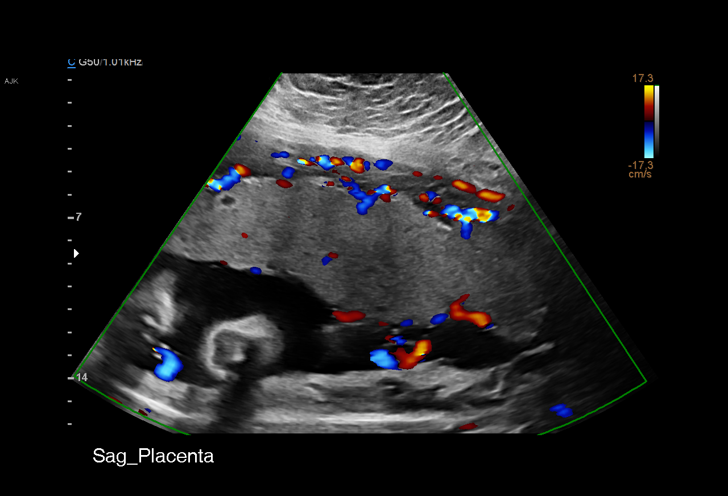
[im 34/58]
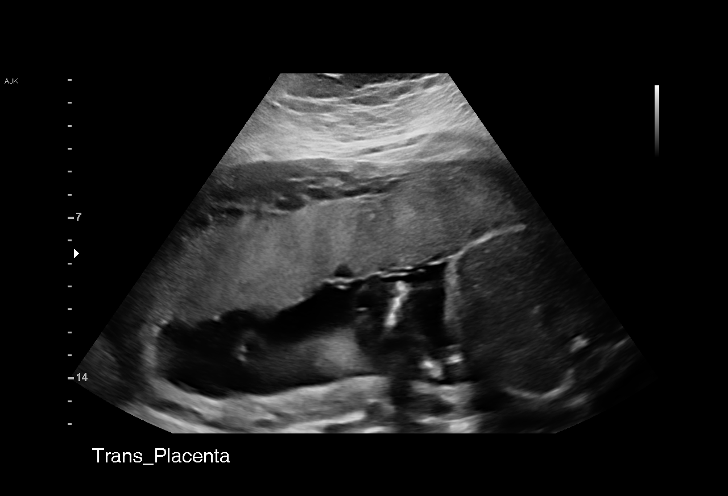
[im 39/58]
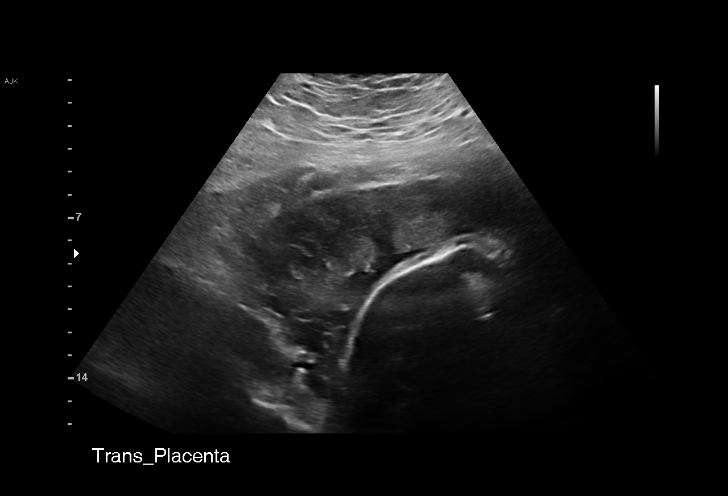
[im 43/58]
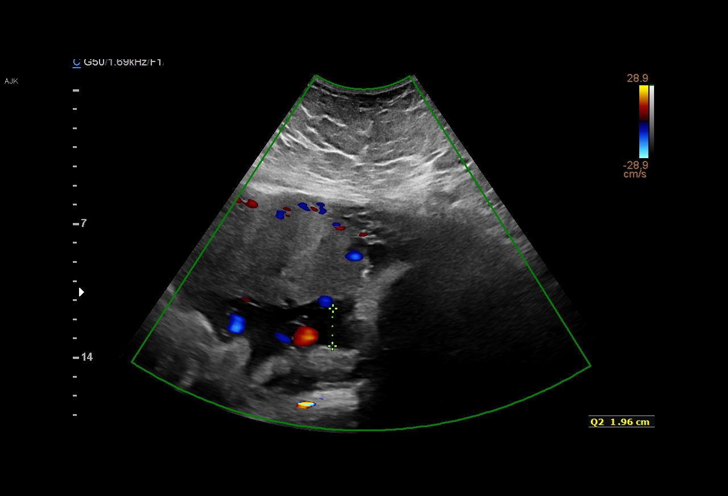
[im 47/58]
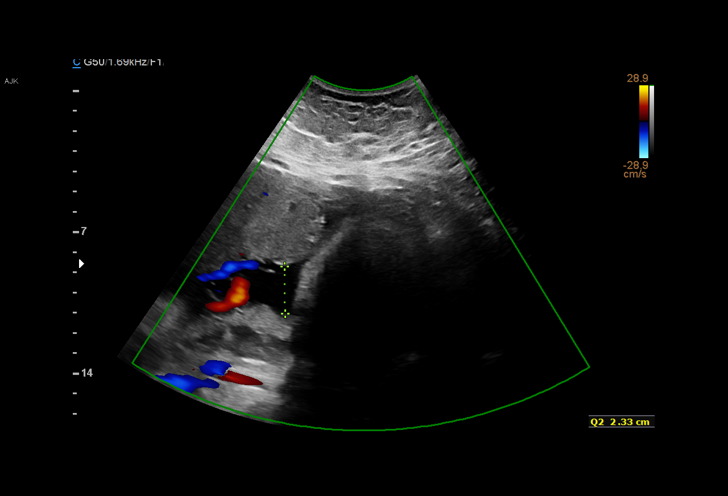
[im 51/58]
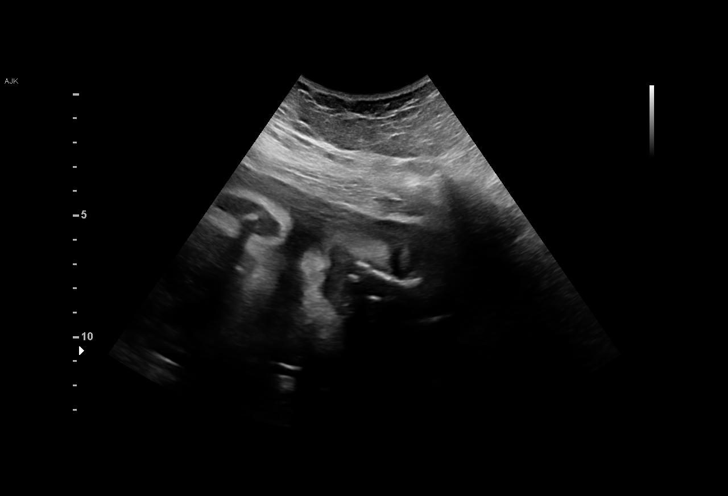
[im 55/58]
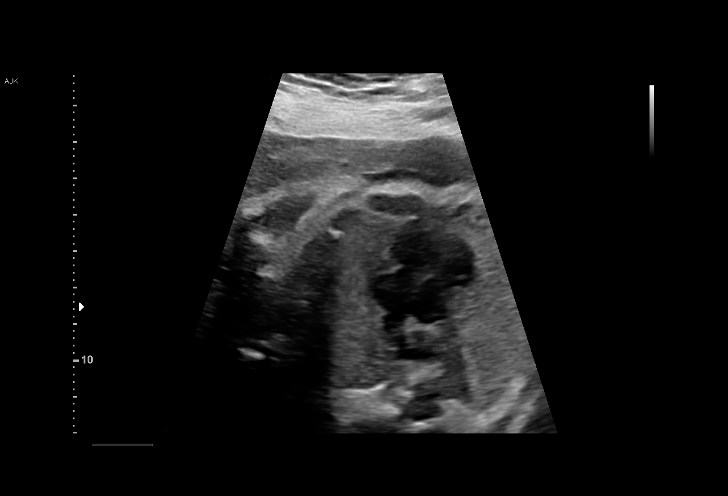

[13 of 28 positions shown; findings below may reference images not displayed]

----------------------------------------------------------------------

 ----------------------------------------------------------------------
Indications

  Previous cesarean delivery, antepartum x 4
  Herpes simplex virus ([facility])
  Obesity complicating pregnancy, third
  trimester (47)
  Advanced maternal age multigravida 35+,
  third trimester (38)
  31 weeks gestation of pregnancy
 ----------------------------------------------------------------------
Vital Signs

 (lb):
 BMI:
Fetal Evaluation

 Num Of Fetuses:          1
 Fetal Heart              155
 Rate(bpm):
 Cardiac Activity:        Observed
 Presentation:            Cephalic
 Placenta:                Anterior
 P. Cord Insertion:       Visualized

 Amniotic Fluid
 AFI FV:      Within normal limits

 AFI Sum(cm)     %Tile       Largest Pocket(cm)
 12.42           34
 RUQ(cm)       RLQ(cm)       LUQ(cm)        LLQ(cm)

Biometry

 BPD:      80.4  mm     G. Age:  32w 2d         74  %    CI:         69.18  %    70 - 86
                                                         FL/HC:       19.6  %    19.3 -
 HC:      308.7  mm     G. Age:  34w 3d         93  %    HC/AC:       1.09       0.96 -
 AC:      283.1  mm     G. Age:  32w 2d         80  %    FL/BPD:      75.4  %    71 - 87
 FL:       60.6  mm     G. Age:  31w 4d         46  %    FL/AC:       21.4  %    20 - 24
 HUM:        55  mm     G. Age:  32w 0d         69  %

 LV:        7.1  mm

 Est. FW:    4129   g      4 lb 5 oz     77  %
                    m
OB History

 Gravidity:    6         Term:   4        Prem:   0         SAB:   1
 TOP:          0       Ectopic:  0        Living: 4
Gestational Age

 LMP:           31w 1d        Date:  01/08/19                 EDD:    10/15/19
 U/S Today:     32w 5d                                        EDD:    10/04/19
 Best:          31w 1d     Det. By:  LMP  (01/08/19)          EDD:    10/15/19
Anatomy

 Cranium:               Appears normal         LVOT:                   Appears normal
 Cavum:                 Appears normal         Aortic Arch:            Appears normal
 Ventricles:            Appears normal         Ductal Arch:            Previously seen
 Choroid Plexus:        Previously seen        Diaphragm:              Appears normal
 Cerebellum:            Appears normal         Stomach:                Appears normal,
                                                                       left sided
 Posterior Fossa:       Appears normal         Abdomen:                Appears normal
 Nuchal Fold:           Not applicable (>20    Abdominal Wall:         Previously seen
                        wks GA)
 Face:                  Orbits and profile     Cord Vessels:           Previously seen
                        previously seen
 Lips:                  Not well visualized    Kidneys:                Appear normal
 Palate:                Previously seen        Bladder:                Appears normal
 Thoracic:              Appears normal         Spine:                  Previously seen
 Heart:                 Appears normal         Upper Extremities:      Previously seen
                        (4CH, axis, and
                        situs)
 RVOT:                  Appears normal         Lower Extremities:      Previously seen

 Other:  Heels previously visualized. Technically difficult due to maternal
         habitus and fetal position.
Impression

 Amniotic fluid is normal and good fetal activity is seen. Fetal
 growth is appropriate for gestational age.
Recommendations

 -An appointment was made for her to return in 4 weeks for
 fetal growth assessment.
                 Mickelsen, Adali

## 2024-05-24 ENCOUNTER — Ambulatory Visit: Payer: Self-pay | Admitting: General Practice

## 2024-05-24 NOTE — Telephone Encounter (Signed)
 FYI Only or Action Required?: FYI only for provider  Patient was last seen in primary care on N/A. Called Nurse Triage reporting Rash. Symptoms began several months ago. Interventions attempted: Dietary changes. Symptoms are: gradually worsening.  Triage Disposition: See PCP When Office is Open (Within 3 Days)  Patient/caregiver understands and will follow disposition?: Patient scheduled for an appointment to establish care on Monday, 7/9. This RN educated pt on new-worsening symptoms and when to call back/seek emergent care. Pt verbalized understanding and agrees to plan.                     Copied from CRM [id number]. Topic: Clinical - Red Word Triage >> May 24, 2024  2:34 PM Ivette P wrote: Red Word that prompted transfer to Nurse Triage: rash on face for about 8-9 weeks. trying to see if different things on own.  started on a few bumps on chin and coming up towards nose and forehead. itchiness, face will get red. havent been able to pin point why. Reason for Disposition  Localized rash present > 7 days  Answer Assessment - Initial Assessment Questions Rash for 8-9 weeks, started on chin now on sides of nose and forehead Not sure what caused it, has trying eliminating things- stopped wearing makeup past couple days, tried eliminating gluten, nothing has stopped it Sometimes it is itchy and painful No difficulty breathing or swallowing Yesterday started not feeling well and used son's blood glucose level (it was 72) so pt drank orange juice and it came up to 100, an hour later it was 101  Protocols used: Rash or Redness - Localized-A-AH

## 2024-05-28 ENCOUNTER — Encounter: Payer: Self-pay | Admitting: Adult Health

## 2024-05-28 ENCOUNTER — Ambulatory Visit (INDEPENDENT_AMBULATORY_CARE_PROVIDER_SITE_OTHER): Admitting: Adult Health

## 2024-05-28 VITALS — BP 124/87 | HR 83 | Temp 97.6°F | Resp 18 | Ht 66.0 in | Wt 252.4 lb

## 2024-05-28 DIAGNOSIS — R519 Headache, unspecified: Secondary | ICD-10-CM

## 2024-05-28 DIAGNOSIS — R21 Rash and other nonspecific skin eruption: Secondary | ICD-10-CM | POA: Diagnosis not present

## 2024-05-28 DIAGNOSIS — Z131 Encounter for screening for diabetes mellitus: Secondary | ICD-10-CM

## 2024-05-28 DIAGNOSIS — Z1231 Encounter for screening mammogram for malignant neoplasm of breast: Secondary | ICD-10-CM

## 2024-05-28 DIAGNOSIS — Z113 Encounter for screening for infections with a predominantly sexual mode of transmission: Secondary | ICD-10-CM

## 2024-05-28 DIAGNOSIS — Z124 Encounter for screening for malignant neoplasm of cervix: Secondary | ICD-10-CM

## 2024-05-28 DIAGNOSIS — E162 Hypoglycemia, unspecified: Secondary | ICD-10-CM | POA: Diagnosis not present

## 2024-05-28 DIAGNOSIS — Z7689 Persons encountering health services in other specified circumstances: Secondary | ICD-10-CM

## 2024-05-28 DIAGNOSIS — B351 Tinea unguium: Secondary | ICD-10-CM | POA: Diagnosis not present

## 2024-05-28 NOTE — Patient Instructions (Signed)
 Preventive Care 16-41 Years Old, Female  Preventive care refers to lifestyle choices and visits with your health care provider that can promote health and wellness. Preventive care visits are also called wellness exams.  What can I expect for my preventive care visit?  Counseling  Your health care provider may ask you questions about your:  Medical history, including:  Past medical problems.  Family medical history.  Pregnancy history.  Current health, including:  Menstrual cycle.  Method of birth control.  Emotional well-being.  Home life and relationship well-being.  Sexual activity and sexual health.  Lifestyle, including:  Alcohol, nicotine or tobacco, and drug use.  Access to firearms.  Diet, exercise, and sleep habits.  Work and work Astronomer.  Sunscreen use.  Safety issues such as seatbelt and bike helmet use.  Physical exam  Your health care provider will check your:  Height and weight. These may be used to calculate your BMI (body mass index). BMI is a measurement that tells if you are at a healthy weight.  Waist circumference. This measures the distance around your waistline. This measurement also tells if you are at a healthy weight and may help predict your risk of certain diseases, such as type 2 diabetes and high blood pressure.  Heart rate and blood pressure.  Body temperature.  Skin for abnormal spots.  What immunizations do I need?    Vaccines are usually given at various ages, according to a schedule. Your health care provider will recommend vaccines for you based on your age, medical history, and lifestyle or other factors, such as travel or where you work.  What tests do I need?  Screening  Your health care provider may recommend screening tests for certain conditions. This may include:  Lipid and cholesterol levels.  Diabetes screening. This is done by checking your blood sugar (glucose) after you have not eaten for a while (fasting).  Pelvic exam and Pap test.  Hepatitis B test.  Hepatitis C  test.  HIV (human immunodeficiency virus) test.  STI (sexually transmitted infection) testing, if you are at risk.  Lung cancer screening.  Colorectal cancer screening.  Mammogram. Talk with your health care provider about when you should start having regular mammograms. This may depend on whether you have a family history of breast cancer.  BRCA-related cancer screening. This may be done if you have a family history of breast, ovarian, tubal, or peritoneal cancers.  Bone density scan. This is done to screen for osteoporosis.  Talk with your health care provider about your test results, treatment options, and if necessary, the need for more tests.  Follow these instructions at home:  Eating and drinking    Eat a diet that includes fresh fruits and vegetables, whole grains, lean protein, and low-fat dairy products.  Take vitamin and mineral supplements as recommended by your health care provider.  Do not drink alcohol if:  Your health care provider tells you not to drink.  You are pregnant, may be pregnant, or are planning to become pregnant.  If you drink alcohol:  Limit how much you have to 0-1 drink a day.  Know how much alcohol is in your drink. In the [place] one drink equals one 12 oz bottle of beer (355 mL), one 5 oz glass of wine (148 mL), or one 1 oz glass of hard liquor (44 mL).  Lifestyle  Brush your teeth every morning and night with fluoride toothpaste. Floss one time each day.  Exercise for at least  30 minutes 5 or more days each week.  Do not use any products that contain nicotine or tobacco. These products include cigarettes, chewing tobacco, and vaping devices, such as e-cigarettes. If you need help quitting, ask your health care provider.  Do not use drugs.  If you are sexually active, practice safe sex. Use a condom or other form of protection to prevent STIs.  If you do not wish to become pregnant, use a form of birth control. If you plan to become pregnant, see your health care provider for a  prepregnancy visit.  Take aspirin only as told by your health care provider. Make sure that you understand how much to take and what form to take. Work with your health care provider to find out whether it is safe and beneficial for you to take aspirin daily.  Find healthy ways to manage stress, such as:  Meditation, yoga, or listening to music.  Journaling.  Talking to a trusted person.  Spending time with friends and family.  Minimize exposure to UV radiation to reduce your risk of skin cancer.  Safety  Always wear your seat belt while driving or riding in a vehicle.  Do not drive:  If you have been drinking alcohol. Do not ride with someone who has been drinking.  When you are tired or distracted.  While texting.  If you have been using any mind-altering substances or drugs.  Wear a helmet and other protective equipment during sports activities.  If you have firearms in your house, make sure you follow all gun safety procedures.  Seek help if you have been physically or sexually abused.  What's next?  Visit your health care provider once a year for an annual wellness visit.  Ask your health care provider how often you should have your eyes and teeth checked.  Stay up to date on all vaccines.  This information is not intended to replace advice given to you by your health care provider. Make sure you discuss any questions you have with your health care provider.  Document Revised: 06/03/2021 Document Reviewed: 06/03/2021  Elsevier Patient Education  2024 [employer].

## 2024-05-28 NOTE — Progress Notes (Unsigned)
 [facility] clinic  Provider:  Inge Mangle DNP  Code Status:  Full Code  Goals of Care:     05/28/2024   10:30 AM  Advanced Directives  Does Patient Have a Medical Advance Directive? Yes  Type of Advance Directive Living will  Does patient want to make changes to medical advance directive? No - Patient declined     Chief Complaint  Patient presents with   Establish Care    New Patient and is not currently on medications expect for vitamins and Advil  as needed for pain or headache.   Discussed the use of AI scribe software for clinical note transcription with the patient, who gave verbal consent to proceed.   HPI: Patient is a 43 y.o. female seen today to establish care with [facility].  She has had a facial rash for the past eight to nine weeks, initially appearing on her chin and spreading to her forehead and cheeks, forming a triangular pattern. The rash is sometimes itchy and painful, with phases of pustule formation. Dietary changes, such as reducing dairy and gluten, and the use of hydrocortisone cream have not provided significant relief. The severity of the rash fluctuates, occasionally improving before worsening again.  She experiences episodes of feeling faint, which have occurred sporadically throughout her life, including during pregnancy and childhood. Last week, she experienced such an episode and measured her blood sugar at 72 mg/dL, despite having eaten earlier in the day. She has a blood glucose meter at home due to her son's type 1 diabetes.  She experiences occasional headaches, often triggered by high salt intake or around her menstrual period, and manages these with Advil  600 mg every six hours as needed.  She mentions a painful condition affecting her toes, specifically the pinky toe on her left foot and the big toe on her right foot, which are yellow, thick, and sometimes painful due to cracking.  Her diet is low in salt due to sensitivity, and she consumes a lot  of fresh vegetables and fruits. 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 She walks extensively during her shifts, often exceeding 10,000 steps. No fever and regular bowel movements, though she experiences stress-related bowel irregularities.    Past Medical History:  Diagnosis Date   Asthma    HSV (herpes simplex virus) anogenital infection     Past Surgical History:  Procedure Laterality Date   CESAREAN SECTION     CESAREAN SECTION N/A 05/25/2017   Procedure: REPEAT CESAREAN SECTION;  Surgeon: Abner Ables, MD;  Location: [facility] BIRTHING SUITES;  Service: Obstetrics;  Laterality: N/A;  PICO DRESSING   CESAREAN SECTION WITH BILATERAL TUBAL LIGATION Bilateral 10/08/2019   Procedure: CESAREAN SECTION WITH BILATERAL TUBAL LIGATION;  Surgeon: Granville Layer, MD;  Location: [facility] LD ORS;  Service: Obstetrics;  Laterality: Bilateral;    No Known Allergies  Outpatient Encounter Medications as of 05/28/2024  Medication Sig   ibuprofen  (ADVIL ) 600 MG tablet Take 1 tablet (600 mg total) by mouth every 6 (six) hours as needed for fever or headache.   Prenat w/o A-FeCbn-Meth-FA-DHA (PRENATE MINI ) 29-0.6-0.4-350 MG CAPS Take 1 capsule by mouth daily before breakfast.   [DISCONTINUED] acetaminophen  (TYLENOL ) 325 MG tablet Take 2 tablets (650 mg total) by mouth every 6 (six) hours as needed for mild pain (temperature > 101.5.). (Patient not taking: Reported on 05/28/2024)   [DISCONTINUED] valACYclovir  (VALTREX ) 1000 MG tablet Take 500 mg by mouth 2 (two)  times daily. (Patient not taking: Reported on 05/28/2024)   No facility-administered encounter medications on file as of 05/28/2024.    Review of Systems:  Review of Systems  Constitutional:  Negative for appetite change, chills, fatigue and fever.  HENT:  Negative for congestion, hearing loss, rhinorrhea and sore throat.   Eyes: Negative.   Respiratory:  Negative for cough, shortness of breath and  wheezing.   Cardiovascular:  Negative for chest pain, palpitations and leg swelling.  Gastrointestinal:  Negative for abdominal pain, constipation, diarrhea, nausea and vomiting.  Genitourinary:  Negative for dysuria.  Musculoskeletal:  Negative for arthralgias, back pain and myalgias.  Skin:  Negative for color change, rash and wound.  Neurological:  Negative for dizziness, weakness and headaches.  Psychiatric/Behavioral:  Negative for behavioral problems. The patient is not nervous/anxious.     Health Maintenance  Topic Date Due   Hepatitis C Screening  Never done   Cervical Cancer Screening (HPV/Pap Cotest)  03/21/2024   COVID-19 Vaccine (1 - 2024-25 season) 12/20/2024 (Originally 08/21/2023)   INFLUENZA VACCINE  07/20/2024   DTaP/Tdap/Td (3 - Td or Tdap) 09/18/2029   HIV Screening  Completed   HPV VACCINES  Aged Out   Meningococcal B Vaccine  Aged Out    Physical Exam: Vitals:   05/28/24 1035  BP: 124/87  Pulse: 83  Resp: 18  Temp: 97.6 F (36.4 C)  SpO2: 99%  Weight: 252 lb 6.4 oz (114.5 kg)  Height: 5' 6 (1.676 m)   Body mass index is 40.74 kg/m. Physical Exam Constitutional:      General: She is not in acute distress.    Appearance: She is obese.  HENT:     Head: Normocephalic and atraumatic.     Nose: Nose normal.     Mouth/Throat:     Mouth: Mucous membranes are moist.  Eyes:     Conjunctiva/sclera: Conjunctivae normal.  Cardiovascular:     Rate and Rhythm: Normal rate and regular rhythm.  Pulmonary:     Effort: Pulmonary effort is normal.     Breath sounds: Normal breath sounds.  Abdominal:     General: Bowel sounds are normal.     Palpations: Abdomen is soft.  Musculoskeletal:        General: Normal range of motion.     Cervical back: Normal range of motion.  Skin:    General: Skin is warm and dry.     Comments: Erythematous rashes on face forming a  triangular shape on nasal area, and  on forehead  Neurological:     General: No focal deficit  present.     Mental Status: She is alert and oriented to person, place, and time.  Psychiatric:        Mood and Affect: Mood normal.        Behavior: Behavior normal.        Thought Content: Thought content normal.        Judgment: Judgment normal.     Labs reviewed: Basic Metabolic Panel: Recent Labs    05/29/24 1055  NA 139  K 4.4  CL 104  CO2 29  GLUCOSE 89  BUN 12  CREATININE 0.74  CALCIUM 8.8   Liver Function Tests: Recent Labs    05/29/24 1055  AST 16  ALT 17  BILITOT 0.6  PROT 6.9   No results for input(s): LIPASE, AMYLASE in the last 8760 hours. No results for input(s): AMMONIA in the last 8760 hours. CBC: Recent Labs  05/29/24 1055  WBC 6.5  NEUTROABS 4,069  HGB 13.2  HCT 41.6  MCV 87.2  PLT 225   Lipid Panel: Recent Labs    05/29/24 1055  CHOL 134  HDL 40*  LDLCALC 80  TRIG 64  CHOLHDL 3.4   Lab Results  Component Value Date   HGBA1C 5.3 05/29/2024    Procedures since last visit: No results found.  Assessment/Plan  1. Facial rash (Primary) -  Suspected rosacea, possibly stress-related. Hydrocortisone ineffective. - Order lab tests for electrolytes and other parameters. - Consider rosacea treatment if lab results are normal. - CBC with Differential/Platelets; Future - Lipid panel; Future  2. Hypoglycemia -  Experienced near syncope with blood glucose of 72 mg/dL. Infrequent episodes. - Order fasting blood glucose test. - Complete Metabolic Panel with eGFR; Future - Lipid panel; Future Addendum:  05/30/24 -  A1C  5.3, average blood sugar in 90 days 105.4, normal  3. Morbidly obese (HCC) -  BMI 40.74, morbidly obese. Emphasized exercise and dietary changes. - Advise 150 minutes of exercise per week. - Recommend low-carb, low-fat diet. - Lipid panel; Future  4. Onychomycosis -  Thickened, yellow nails with pain and cracking. Suspected fungal infection. - Refer to podiatry for evaluation and management. - Ambulatory  referral to Podiatry  5. Screening mammogram for breast cancer - MM 3D SCREENING MAMMOGRAM BILATERAL BREAST  6. Encounter to establish care -  established care with [facility]  7. Screen for STD (sexually transmitted disease) - Hep C Antibody; Future  8. Screening for cervical cancer - Ambulatory referral to Obstetrics / Gynecology  9. Screening for diabetes mellitus - Hemoglobin A1C; Future     General Health Maintenance 43 years old, overdue for mammogram and Pap smear. Discussed importance of screenings. - Schedule mammogram. - Refer to OB/GYN for Pap smear. - Order CBC and cholesterol levels. - Screen for Hepatitis C.  Follow-up Advised follow-up for lab results and management. - Schedule follow-up appointment in one week.    Labs/tests ordered:  A1C, lipid panel, CBC, CMP, hep C antibody, mammogram   Return in about 1 week (around 06/04/2024).  Jean Lastinger Medina-Vargas, NP

## 2024-05-29 ENCOUNTER — Other Ambulatory Visit: Payer: MEDICAID

## 2024-05-29 DIAGNOSIS — Z131 Encounter for screening for diabetes mellitus: Secondary | ICD-10-CM

## 2024-05-29 DIAGNOSIS — Z113 Encounter for screening for infections with a predominantly sexual mode of transmission: Secondary | ICD-10-CM

## 2024-05-29 DIAGNOSIS — R519 Headache, unspecified: Secondary | ICD-10-CM

## 2024-05-30 ENCOUNTER — Ambulatory Visit: Payer: Self-pay | Admitting: Adult Health

## 2024-05-30 NOTE — Progress Notes (Signed)
-   Cholesterol, triglycerides, LDL, electrolytes, liver enzymes, all within normal -   No anemia -  Not diabetic

## 2024-05-31 LAB — COMPLETE METABOLIC PANEL WITHOUT GFR
AG Ratio: 1.7 (calc) (ref 1.0–2.5)
ALT: 17 U/L (ref 6–29)
AST: 16 U/L (ref 10–30)
Albumin: 4.3 g/dL (ref 3.6–5.1)
Alkaline phosphatase (APISO): 43 U/L (ref 31–125)
BUN: 12 mg/dL (ref 7–25)
CO2: 29 mmol/L (ref 20–32)
Calcium: 8.8 mg/dL (ref 8.6–10.2)
Chloride: 104 mmol/L (ref 98–110)
Creat: 0.74 mg/dL (ref 0.50–0.99)
Globulin: 2.6 g/dL (ref 1.9–3.7)
Glucose, Bld: 89 mg/dL (ref 65–99)
Potassium: 4.4 mmol/L (ref 3.5–5.3)
Sodium: 139 mmol/L (ref 135–146)
Total Bilirubin: 0.6 mg/dL (ref 0.2–1.2)
Total Protein: 6.9 g/dL (ref 6.1–8.1)

## 2024-05-31 LAB — HEMOGLOBIN A1C
Hgb A1c MFr Bld: 5.3 % (ref <5.7)
Mean Plasma Glucose: 105 mg/dL
eAG (mmol/L): 5.8 mmol/L

## 2024-05-31 LAB — CBC WITH DIFFERENTIAL/PLATELET
Absolute Lymphocytes: 1567 {cells}/uL (ref 850–3900)
Absolute Monocytes: 546 {cells}/uL (ref 200–950)
Basophils Absolute: 78 {cells}/uL (ref 0–200)
Basophils Relative: 1.2 %
Eosinophils Absolute: 241 {cells}/uL (ref 15–500)
Eosinophils Relative: 3.7 %
HCT: 41.6 % (ref 35.0–45.0)
Hemoglobin: 13.2 g/dL (ref 11.7–15.5)
MCH: 27.7 pg (ref 27.0–33.0)
MCHC: 31.7 g/dL — ABNORMAL LOW (ref 32.0–36.0)
MCV: 87.2 fL (ref 80.0–100.0)
MPV: 11.4 fL (ref 7.5–12.5)
Monocytes Relative: 8.4 %
Neutro Abs: 4069 {cells}/uL (ref 1500–7800)
Neutrophils Relative %: 62.6 %
Platelets: 225 10*3/uL (ref 140–400)
RBC: 4.77 10*6/uL (ref 3.80–5.10)
RDW: 13.3 % (ref 11.0–15.0)
Total Lymphocyte: 24.1 %
WBC: 6.5 10*3/uL (ref 3.8–10.8)

## 2024-05-31 LAB — LIPID PANEL
Cholesterol: 134 mg/dL (ref <200)
HDL: 40 mg/dL — ABNORMAL LOW (ref >=50)
LDL Cholesterol (Calc): 80 mg/dL
Non-HDL Cholesterol (Calc): 94 mg/dL (ref <130)
Total CHOL/HDL Ratio: 3.4 (calc) (ref <5.0)
Triglycerides: 64 mg/dL (ref <150)

## 2024-05-31 LAB — HEPATITIS C ANTIBODY: Hepatitis C Ab: NONREACTIVE

## 2024-06-01 NOTE — Progress Notes (Signed)
-     cholesterol, triglycerides, LDL, normal -  no anemia -  hep C antibody negative -  electrolytes and liver enzymes norma -  not diabetic

## 2024-06-04 ENCOUNTER — Ambulatory Visit (INDEPENDENT_AMBULATORY_CARE_PROVIDER_SITE_OTHER): Admitting: Adult Health

## 2024-06-04 ENCOUNTER — Encounter: Payer: Self-pay | Admitting: Adult Health

## 2024-06-04 VITALS — BP 112/75 | HR 93 | Temp 97.6°F | Resp 18 | Ht 66.0 in | Wt 255.0 lb

## 2024-06-04 DIAGNOSIS — L719 Rosacea, unspecified: Secondary | ICD-10-CM

## 2024-06-04 DIAGNOSIS — B351 Tinea unguium: Secondary | ICD-10-CM | POA: Diagnosis not present

## 2024-06-04 MED ORDER — METRONIDAZOLE 1 % EX GEL: Freq: Every day | CUTANEOUS | 5 refills | Status: DC

## 2024-06-04 MED ORDER — METRONIDAZOLE 1 % EX GEL: Freq: Every day | CUTANEOUS | 0 refills | Status: DC

## 2024-06-04 NOTE — Patient Instructions (Signed)
 Rosacea Rosacea is a long-term (chronic) condition that affects the skin of the face, including the cheeks, nose, forehead, and chin. This condition can also affect the eyes. Rosacea causes blood vessels near the surface of the skin to get bigger (be enlarged), and that makes the skin red. What are the causes? The cause of this condition is not known. Certain things can make rosacea worse, including: Exercise. Sunlight. Very hot or cold temperatures. Hot or spicy foods and drinks. Drinking alcohol. Stress. Taking blood pressure medicine. Long-term use of topical steroids on the face. What increases the risk? You are more likely to get this condition if you: Are older than 41 years of age. Are a woman. Have light-colored skin (light complexion). Have a family history of the condition. What are the signs or symptoms?  Redness of the face. Red bumps or pimples on the face. A red, enlarged nose. Blushing easily. Red lines on the skin. Eye problems such as: Irritated, burning, or itchy feeling in the eyes. Swollen eyelids. Drainage from the eyes. Feeling like there is something in your eye. How is this treated? There is no cure for this condition, but treatment can help to control your symptoms. Your doctor may suggest that you see a skin specialist (dermatologist). Treatment may include: Medicines that are put on the skin or taken by mouth (orally). Laser treatment to improve how the skin looks. Surgery. This is rare. Your doctor will also suggest the best way to take care of your skin. Even after your skin gets better, you will likely need to continue treatment to keep your rosacea from coming back. Follow these instructions at home: Skin care Take care of your skin as told by your doctor. Your doctor may tell you to do these things: Wash your skin gently two or more times each day. Use mild soap. Use a sunscreen or sunblock with SPF 30 or greater. Use gentle cosmetics that are  meant for sensitive skin. Shave with an electric shaver instead of a blade. Lifestyle Try to keep track of what foods make this condition worse. Avoid those foods. These may include: Spicy foods. Seafood. Cheese. Hot liquids. Nuts. Chocolate. Iodized salt. Do not drink alcohol. Avoid very cold or hot temperatures. Try to reduce your stress. If you need help to do this, talk with your doctor. When you exercise, do these things to stay cool: Limit sun exposure to your face. Use a fan. Exercise for a shorter time, and exercise more often. General instructions Take and apply over-the-counter and prescription medicines only as told by your doctor. If you were prescribed antibiotics, apply it or take them as told by your doctor. Do not stop using them even if your condition improves. If your eyelids are affected, hold warm compresses on them. Do this as told by your doctor. Keep all follow-up visits. Contact a doctor if: Your symptoms get worse. Your symptoms do not improve after 2 months of treatment. You have new symptoms. You have any changes in how you see (vision) or you have problems with your eyes, such as redness or itching. You feel very sad (depressed). You do not want to eat as much as normal (lose your appetite). You have trouble focusing your mind (concentrating). Summary Rosacea is a long-term condition that affects the skin of the face, including the cheeks, nose, forehead, and chin. Take care of your skin as told by your doctor. Take and apply medicines only as told by your doctor. Contact a doctor if  your symptoms get worse or if you have problems with your eyes. Keep all follow-up visits. This information is not intended to replace advice given to you by your health care provider. Make sure you discuss any questions you have with your health care provider. Document Revised: 01/27/2022 Document Reviewed: 01/27/2022 Elsevier Patient Education  2024 [employer].

## 2024-06-04 NOTE — Progress Notes (Signed)
 [facility]  Provider:  Jereld Serum DNP  Code Status:  Full Code  Goals of Care:     05/28/2024   10:30 AM  Advanced Directives  Does Patient Have a Medical Advance Directive? Yes  Type of Advance Directive Living will  Does patient want to make changes to medical advance directive? No - Patient declined     Chief Complaint  Patient presents with   Medical Management of Chronic Issues    1 week follow-up   Discussed the use of AI scribe software for clinical note transcription with the patient, who gave verbal consent to proceed.   HPI: Patient is a 43 y.o. female seen today for a 1 week follow up of labs.  She has been experiencing facial redness and rash primarily located in a triangular area on her face and forehead for approximately ten weeks. The condition worsens after consuming bread, suggesting a possible dietary trigger. The affected area is red and painful, particularly on her chin. No eye symptoms such as itchiness or a gritty feeling are present.  She has a history of good skin health, with no significant issues during adolescence or pregnancy. This facial rash is a new development, and she has never had problems with her face before, even during more stressful periods in her life.  In terms of her social history, she does not smoke and only drinks alcohol occasionally, such as wine. She mentions a sedentary lifestyle due to work and school commitments, which involves long hours in front of a computer.  Her family history includes a son with type 1 diabetes, and she is aware of the condition's management due to her involvement in his care. She has had her tubes tied, eliminating the possibility of pregnancy.    Past Medical History:  Diagnosis Date   Asthma    HSV (herpes simplex virus) anogenital infection     Past Surgical History:  Procedure Laterality Date   CESAREAN SECTION     CESAREAN SECTION N/A 05/25/2017   Procedure: REPEAT CESAREAN SECTION;   Surgeon: Eldonna Suzen Octave, MD;  Location: [facility] BIRTHING SUITES;  Service: Obstetrics;  Laterality: N/A;  PICO DRESSING   CESAREAN SECTION WITH BILATERAL TUBAL LIGATION Bilateral 10/08/2019   Procedure: CESAREAN SECTION WITH BILATERAL TUBAL LIGATION;  Surgeon: Fredirick Glenys RAMAN, MD;  Location: [facility] LD ORS;  Service: Obstetrics;  Laterality: Bilateral;    No Known Allergies  Outpatient Encounter Medications as of 06/04/2024  Medication Sig   ibuprofen  (ADVIL ) 600 MG tablet Take 1 tablet (600 mg total) by mouth every 6 (six) hours as needed for fever or headache.   Prenat w/o A-FeCbn-Meth-FA-DHA (PRENATE MINI ) 29-0.6-0.4-350 MG CAPS Take 1 capsule by mouth daily before breakfast.   [DISCONTINUED] metroNIDAZOLE  (METROGEL ) 1 % gel Apply topically daily.   metroNIDAZOLE  (METROGEL ) 1 % gel Apply topically daily.   No facility-administered encounter medications on file as of 06/04/2024.    Review of Systems:  Review of Systems  Constitutional:  Negative for appetite change, chills, fatigue and fever.  HENT:  Negative for congestion, hearing loss, rhinorrhea and sore throat.   Eyes: Negative.   Respiratory:  Negative for cough, shortness of breath and wheezing.   Cardiovascular:  Negative for chest pain, palpitations and leg swelling.  Gastrointestinal:  Negative for abdominal pain, constipation, diarrhea, nausea and vomiting.  Genitourinary:  Negative for dysuria.  Musculoskeletal:  Negative for arthralgias, back pain and myalgias.  Skin:  Negative for color change, rash and wound.  Neurological:  Negative for dizziness, weakness and headaches.  Psychiatric/Behavioral:  Negative for behavioral problems. The patient is not nervous/anxious.     Health Maintenance  Topic Date Due   HPV VACCINES (1 - 3-dose SCDM series) Never done   Cervical Cancer Screening (HPV/Pap Cotest)  03/21/2024   COVID-19 Vaccine (1 - 2024-25 season) 12/20/2024 (Originally 08/21/2023)   INFLUENZA VACCINE  07/20/2024    DTaP/Tdap/Td (3 - Td or Tdap) 09/18/2029   Hepatitis C Screening  Completed   HIV Screening  Completed   Meningococcal B Vaccine  Aged Out    Physical Exam: Vitals:   06/04/24 1103  BP: 112/75  Pulse: 93  Resp: 18  Temp: 97.6 F (36.4 C)  SpO2: 96%  Weight: 255 lb (115.7 kg)  Height: 5' 6 (1.676 m)   Body mass index is 41.16 kg/m. Physical Exam Constitutional:      General: She is not in acute distress.    Appearance: She is obese.  HENT:     Head: Normocephalic and atraumatic.     Nose: Nose normal.     Mouth/Throat:     Mouth: Mucous membranes are moist.   Eyes:     Conjunctiva/sclera: Conjunctivae normal.    Cardiovascular:     Rate and Rhythm: Normal rate and regular rhythm.  Pulmonary:     Effort: Pulmonary effort is normal.     Breath sounds: Normal breath sounds.  Abdominal:     General: Bowel sounds are normal.     Palpations: Abdomen is soft.   Musculoskeletal:        General: Normal range of motion.     Cervical back: Normal range of motion.   Skin:    General: Skin is warm and dry.     Findings: Rash present.     Comments: Erythematous rashes on face and forehead   Neurological:     General: No focal deficit present.     Mental Status: She is alert and oriented to person, place, and time.   Psychiatric:        Mood and Affect: Mood normal.        Behavior: Behavior normal.        Thought Content: Thought content normal.        Judgment: Judgment normal.     Labs reviewed: Basic Metabolic Panel: Recent Labs    05/29/24 1055  NA 139  K 4.4  CL 104  CO2 29  GLUCOSE 89  BUN 12  CREATININE 0.74  CALCIUM 8.8   Liver Function Tests: Recent Labs    05/29/24 1055  AST 16  ALT 17  BILITOT 0.6  PROT 6.9   No results for input(s): LIPASE, AMYLASE in the last 8760 hours. No results for input(s): AMMONIA in the last 8760 hours. CBC: Recent Labs    05/29/24 1055  WBC 6.5  NEUTROABS 4,069  HGB 13.2  HCT 41.6  MCV 87.2   PLT 225   Lipid Panel: Recent Labs    05/29/24 1055  CHOL 134  HDL 40*  LDLCALC 80  TRIG 64  CHOLHDL 3.4   Lab Results  Component Value Date   HGBA1C 5.3 05/29/2024    Procedures since last visit: No results found.  Assessment/Plan  1. Rosacea (Primary) -  Rosacea with facial redness and discomfort, exacerbated by certain foods. Discussed potential triggers and provided educational material. Metronidazole  1% gel preferred by insurance. - Prescribe metronidazole  1% gel for daily application to affected areas. -  Advise sun avoidance and protective measures when outdoors. - Instruct to use gel only during flare-ups after initial daily application period. - Reassess in three weeks; consider dermatology referral if no improvement. - metroNIDAZOLE  (METROGEL ) 1 % gel; Apply topically daily.  Dispense: 60 g; Refill: 5  2. Morbidly obese (HCC) -  counseled regarding diet and exercise  3. Onychomycosis -  scheduled podiatrist follow-up. - Follow up with the podiatrist as scheduled next week, 06/12/24    General Health Maintenance Engaged in routine health maintenance. Aware of weight management importance and discussed weight loss goals. - Proceed with the scheduled Pap smear on Wednesday, 06/06/24 - Confirm mammogram appointment and follow up if necessary. - Set a goal to lose five pounds in three weeks and discuss progress at the next follow-up.    Labs/tests ordered:  None   Return in about 3 weeks (around 06/25/2024).  Jean Waltermire Medina-Vargas, NP

## 2024-06-06 ENCOUNTER — Ambulatory Visit: Payer: MEDICAID | Admitting: Obstetrics and Gynecology

## 2024-06-12 ENCOUNTER — Ambulatory Visit: Admitting: Podiatry

## 2024-06-14 ENCOUNTER — Ambulatory Visit: Admitting: Podiatry

## 2024-06-26 ENCOUNTER — Ambulatory Visit: Payer: MEDICAID | Admitting: Obstetrics and Gynecology

## 2024-06-28 ENCOUNTER — Ambulatory Visit: Admitting: Adult Health

## 2024-06-29 ENCOUNTER — Ambulatory Visit: Payer: Self-pay

## 2024-06-29 NOTE — Telephone Encounter (Signed)
 FYI Only or Action Required?: Action required by provider: request for appointment and patient is recommended to UC today-going to try to go-based on her husband's schedule-needing follow up call.  Patient was last seen in primary care on 06/04/2024 by Medina-Vargas, Jereld BROCKS, NP.  Called Nurse Triage reporting Rash.  Symptoms began several weeks ago.  Interventions attempted: OTC medications: Benadryl  and Rest, hydration, or home remedies.  Symptoms are: unchanged.  Triage Disposition: See HCP Within 4 Hours (Or PCP Triage)  Patient/caregiver understands and will follow disposition?: No, wishes to speak with PCP  Copied from CRM [id number]. Topic: Clinical - Red Word Triage >> Jun 29, 2024 10:59 AM Mercer PEDLAR wrote: Red Word that prompted transfer to Nurse Triage: Swollen face and itchiness. Reason for Disposition  [1] Looks infected (e.g., spreading redness, pus) AND [2] large red area (> 2 inches or 5 cm)  Answer Assessment - Initial Assessment Questions 1. APPEARANCE of RASH: What does the rash look like? (e.g., blisters, dry flaky skin, red spots, redness, sores)     redness 2. LOCATION: Where is the rash located?      Started on her face and now is going to her neck 3. NUMBER: How many spots are there?      splotchy 4. SIZE: How big are the spots? (e.g., inches, cm; or compare to size of pinhead, tip of pen, eraser, pea)      Splotchy areas of redness 5. ONSET: When did the rash start?      Started last month 6. ITCHING: Does the rash itch? If Yes, ask: How bad is the itch?  (Scale 0-10; or none, mild, moderate, severe)     Mild-moderate 7. PAIN: Does the rash hurt? If Yes, ask: How bad is the pain?  (Scale 0-10; or none, mild, moderate, severe)     mild 8. OTHER SYMPTOMS: Do you have any other symptoms? (e.g., fever)     Patient feels like her face is swollen.   Patient calling with concerns for continued rash to her face and now to her neck. Patient  endorses itching along with medium sized spots to her face. Patient recommended to urgent care to be evaluated. 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 Unsure if she is okay to work her shift with the rash increasing over her face. Patient is questioning if she is able to get a work note for [employer]. Patient is asking for a follow up phone call.  Protocols used: Rash or Redness - Localized-A-AH

## 2024-06-29 NOTE — Telephone Encounter (Signed)
 Donnel Herter, RN to [facility] (Selected Message)     06/29/24 11:24 AM FYI-see triage-patient needing a call back today-recommended to follow up at urgent care due to increase in symptoms.

## 2024-06-29 NOTE — Telephone Encounter (Signed)
 Noted. I agree that she be seen at an urgent care. I see she has a schedule to follow up at [facility] on 07/06/24.

## 2024-06-29 NOTE — Telephone Encounter (Signed)
 Message routed to PCP Medina-Vargas, Monina C, NP as FYI.

## 2024-07-05 ENCOUNTER — Ambulatory Visit: Admitting: Podiatry

## 2024-07-06 ENCOUNTER — Ambulatory Visit: Admitting: Adult Health

## 2024-07-10 ENCOUNTER — Ambulatory Visit: Payer: MEDICAID | Admitting: Advanced Practice Midwife

## 2024-07-17 ENCOUNTER — Ambulatory Visit: Admitting: Podiatry

## 2024-07-19 ENCOUNTER — Ambulatory Visit: Payer: MEDICAID | Admitting: Family Medicine

## 2024-07-20 ENCOUNTER — Ambulatory Visit: Admitting: Adult Health

## 2024-07-26 ENCOUNTER — Ambulatory Visit: Admitting: Podiatry

## 2024-07-31 ENCOUNTER — Ambulatory Visit: Payer: MEDICAID | Admitting: Advanced Practice Midwife

## 2024-08-02 ENCOUNTER — Ambulatory Visit: Admitting: Podiatry

## 2024-08-07 ENCOUNTER — Encounter: Payer: Self-pay | Admitting: Podiatry

## 2024-08-07 ENCOUNTER — Ambulatory Visit (INDEPENDENT_AMBULATORY_CARE_PROVIDER_SITE_OTHER): Admitting: Podiatry

## 2024-08-07 ENCOUNTER — Ambulatory Visit (INDEPENDENT_AMBULATORY_CARE_PROVIDER_SITE_OTHER)

## 2024-08-07 ENCOUNTER — Other Ambulatory Visit: Payer: Self-pay | Admitting: Podiatry

## 2024-08-07 DIAGNOSIS — B351 Tinea unguium: Secondary | ICD-10-CM

## 2024-08-07 DIAGNOSIS — M2042 Other hammer toe(s) (acquired), left foot: Secondary | ICD-10-CM

## 2024-08-07 DIAGNOSIS — M722 Plantar fascial fibromatosis: Secondary | ICD-10-CM

## 2024-08-07 MED ORDER — METHYLPREDNISOLONE 4 MG PO TBPK: ORAL_TABLET | ORAL | 0 refills | Status: DC

## 2024-08-07 MED ORDER — MELOXICAM 15 MG PO TABS: 15.0000 mg | ORAL_TABLET | Freq: Every day | ORAL | 3 refills | Status: DC

## 2024-08-07 NOTE — Progress Notes (Signed)
 Subjective:  Patient ID: Jean Fox, female    DOB: 04/16/1981,  MRN: [id number] HPI Chief Complaint  Patient presents with   Toe Pain    5th toe left - lateral border, thick nail/skin x years   Nail Problem    Toenails right - concerned about fungus, also affecting skin   New Patient (Initial Visit)    43 y.o. female presents with the above complaint.   ROS: Denies fever chills nausea mobic  muscle aches pains calf pain back pain chest pain shortness of breath.  Past Medical History:  Diagnosis Date   Asthma    HSV (herpes simplex virus) anogenital infection    Past Surgical History:  Procedure Laterality Date   CESAREAN SECTION     CESAREAN SECTION N/A 05/25/2017   Procedure: REPEAT CESAREAN SECTION;  Surgeon: Jean Suzen Octave, MD;  Location: [facility] BIRTHING SUITES;  Service: Obstetrics;  Laterality: N/A;  PICO DRESSING   CESAREAN SECTION WITH BILATERAL TUBAL LIGATION Bilateral 10/08/2019   Procedure: CESAREAN SECTION WITH BILATERAL TUBAL LIGATION;  Surgeon: Jean Glenys RAMAN, MD;  Location: [facility] LD ORS;  Service: Obstetrics;  Laterality: Bilateral;    Current Outpatient Medications:    meloxicam  (MOBIC ) 15 MG tablet, Take 1 tablet (15 mg total) by mouth daily., Disp: 30 tablet, Rfl: 3   methylPREDNISolone  (MEDROL  DOSEPAK) 4 MG TBPK tablet, 6 day dose pack - take as directed, Disp: 21 tablet, Rfl: 0  No Known Allergies Review of Systems Objective:  There were no vitals filed for this visit.  General: Well developed, nourished, in no acute distress, alert and oriented x3   Dermatological: Skin is warm, dry and supple bilateral. Nails x 10 are well maintained; remaining integument appears unremarkable at this time. There are no open sores, no preulcerative lesions, she has eczema plantar aspect of the bilateral foot.  Hallux nail right and fourth demonstrate possible fungus but cannot rule out onychomycosis.  She also has a listers corn lateral aspect fifth digit  left.   Vascular: Dorsalis Pedis artery and Posterior Tibial artery pedal pulses are 2/4 bilateral with immedate capillary fill time. Pedal hair growth present. No varicosities and no lower extremity edema present bilateral.   Neruologic: Grossly intact via light touch bilateral. Vibratory intact via tuning fork bilateral. Protective threshold with Semmes Wienstein monofilament intact to all pedal sites bilateral. Patellar and Achilles deep tendon reflexes 2+ bilateral. No Babinski or clonus noted bilateral.   Musculoskeletal: No gross boney pedal deformities bilateral. No pain, crepitus, or limitation noted with foot and ankle range of motion bilateral. Muscular strength 5/5 in all groups tested bilateral.  Hammertoe deformity fifth left most likely follow using the listers corn.  She has severe pain on palpation medial calcaneal tubercle of the right heel.  Gait: Unassisted, Nonantalgic.    Radiographs:  Radiographs taken today demonstrates soft tissue increase in density plantar fashion calcaneal insertion site of the right foot and hammertoe deformity of the left foot density to the bone appears to be normal does not demonstrate any acute findings either foot.  Assessment & Plan:   Assessment: Ingrown nail fifth digit left listers corn.  Nail dystrophy possible onychomycosis 1st and 4th toes right foot.  Plantar fasciitis right foot.  Eczematous dermatitis bilateral foot.  Plan: Discussed etiology pathology conservative surgical therapies at this point were having to ask Jean Fox if we can perform a cortisone injection to her right heel.  I did go ahead and prescribe her medication today to help alleviate her  symptoms consisting of methylprednisolone  and meloxicam .  We are also going to ask if we could perform a nail biopsy which will be sent for PCR and histopathology.  We also need to perform a chemical matricectomy to the fifth digit left foot.  I will follow-up with her in 1 month  hopefully we will have answers by that time.     Jean Fox, [place]

## 2024-08-08 ENCOUNTER — Encounter: Payer: Self-pay | Admitting: *Deleted

## 2024-08-15 ENCOUNTER — Ambulatory Visit: Payer: MEDICAID | Admitting: Obstetrics and Gynecology

## 2024-09-11 ENCOUNTER — Ambulatory Visit: Admitting: Podiatry

## 2024-09-11 ENCOUNTER — Ambulatory Visit: Payer: MEDICAID | Admitting: Obstetrics and Gynecology

## 2024-10-08 ENCOUNTER — Ambulatory Visit: Payer: MEDICAID | Admitting: Obstetrics and Gynecology

## 2024-10-24 ENCOUNTER — Ambulatory Visit: Payer: Self-pay

## 2024-10-24 NOTE — Telephone Encounter (Signed)
 FYI Only or Action Required?: FYI only for provider: appointment scheduled on 11/6.  Patient was last seen in primary care on 06/04/2024 by Medina-Vargas, Jereld BROCKS, NP.  Called Nurse Triage reporting Cough.  Symptoms began several days ago.  Symptoms are: gradually worsening.  Triage Disposition: See Physician Within 24 Hours  Patient/caregiver understands and will follow disposition?: Yes      Copied from CRM #[id number]. Topic: Clinical - Red Word Triage >> Oct 24, 2024 12:54 PM Debby BROCKS wrote: Red Word that prompted transfer to Nurse Triage: Starting last Friday patient has had alot of coughing and wheezing. Coudlnt sleep at night.  Today she has tightness in her chest and the cough keeps getting worse throughout the day. Harder to breathe naturally. Used to use an inhaler but doesnt have one anymore     Reason for Disposition  [1] Continuous (nonstop) coughing interferes with work or school AND [2] no improvement using cough treatment per Care Advice  Answer Assessment - Initial Assessment Questions 1. ONSET: When did the cough begin?      5 days ago 2. SEVERITY: How bad is the cough today?      Moderate  3. SPUTUM: Describe the color of your sputum (e.g., none, dry cough; clear, white, yellow, green)     Some intermittent green sputum  4. HEMOPTYSIS: Are you coughing up any blood? If Yes, ask: How much? (e.g., flecks, streaks, tablespoons, etc.)     No 5. DIFFICULTY BREATHING: Are you having difficulty breathing? If Yes, ask: How bad is it? (e.g., mild, moderate, severe)      Mild  6. FEVER: Do you have a fever? If Yes, ask: What is your temperature, how was it measured, and when did it start?     No 7. CARDIAC HISTORY: Do you have any history of heart disease? (e.g., heart attack, congestive heart failure)      No 8. LUNG HISTORY: Do you have any history of lung disease?  (e.g., pulmonary embolus, asthma, emphysema)     No 9. PE RISK FACTORS: Do  you have a history of blood clots? (or: recent major surgery, recent prolonged travel, bedridden)     No 10. OTHER SYMPTOMS: Do you have any other symptoms? (e.g., runny nose, wheezing, chest pain)       Chest pain with cough that is now lingering  Protocols used: Cough - Acute Productive-A-AH

## 2024-10-25 ENCOUNTER — Encounter: Payer: Self-pay | Admitting: Adult Health

## 2024-10-25 ENCOUNTER — Ambulatory Visit: Payer: MEDICAID | Admitting: Adult Health

## 2024-10-25 VITALS — BP 124/70 | HR 83 | Temp 97.8°F | Ht 66.0 in | Wt 243.8 lb

## 2024-10-25 DIAGNOSIS — R051 Acute cough: Secondary | ICD-10-CM

## 2024-10-25 DIAGNOSIS — L719 Rosacea, unspecified: Secondary | ICD-10-CM

## 2024-10-25 DIAGNOSIS — J209 Acute bronchitis, unspecified: Secondary | ICD-10-CM

## 2024-10-25 MED ORDER — AZITHROMYCIN 250 MG PO TABS: ORAL_TABLET | ORAL | 0 refills | Status: AC

## 2024-10-25 MED ORDER — BENZONATATE 100 MG PO CAPS: 100.0000 mg | ORAL_CAPSULE | Freq: Three times a day (TID) | ORAL | 0 refills | Status: AC | PRN

## 2024-10-25 MED ORDER — ALBUTEROL SULFATE HFA 108 (90 BASE) MCG/ACT IN AERS: 2.0000 | INHALATION_SPRAY | Freq: Four times a day (QID) | RESPIRATORY_TRACT | 0 refills | Status: AC | PRN

## 2024-10-25 NOTE — Progress Notes (Signed)
 [facility] clinic  Provider:  Jereld Serum DNP  Code Status:  Full Code  Goals of Care:     05/28/2024   10:30 AM  Advanced Directives  Does Patient Have a Medical Advance Directive? Yes  Type of Advance Directive Living will  Does patient want to make changes to medical advance directive? No - Patient declined     Chief Complaint  Patient presents with   Cough    Started about a week and half ago but became progressively worse this past Friday. Denies fever. Son had been sick and she think she may have gotten it from him   Discussed the use of AI scribe software for clinical note transcription with the patient, who gave verbal consent to proceed.  HPI: Patient is a 43 y.o. female seen today for an acute visit for cough.   She has been experiencing a worsening cough for approximately one and a half weeks, with increased severity since last Friday. The cough is particularly bothersome at night, causing difficulty sleeping. She describes a burning sensation in her chest due to the coughing and hears crackling sounds when trying to sleep. She experiences wheezing at night but no shortness of breath. She has not been using any specific medication for the cough, only taking ibuprofen  occasionally for headache relief caused by the coughing. She notes the presence of greenish phlegm but no fever or body aches.  She has a history of asthma and has used an inhaler in the past, although she does not currently have one. Her son, who also has asthma, was sick two weeks ago and required an inhaler and a steroid, which she believes may have been the source of her current illness. She has not been taking any medications regularly, including Solu Medrol  or Mobic , and does not currently have an inhaler.  In terms of her social history, she is on a diet to lose weight, focusing on reducing carbohydrate intake and increasing protein consumption. She has lost weight, going from 255 pounds to 243 pounds,  and has dropped clothing sizes. She is a naval architect social work and attributes some of her stress to her academic workload (studying to get her social work degree), which she believes exacerbates her rosacea. She has three boys and two girls, with her youngest daughter being 9 years old.     Past Medical History:  Diagnosis Date   Asthma    HSV (herpes simplex virus) anogenital infection     Past Surgical History:  Procedure Laterality Date   CESAREAN SECTION     CESAREAN SECTION N/A 05/25/2017   Procedure: REPEAT CESAREAN SECTION;  Surgeon: Eldonna Suzen Octave, MD;  Location: [facility];  Service: Obstetrics;  Laterality: N/A;  PICO DRESSING   CESAREAN SECTION WITH BILATERAL TUBAL LIGATION Bilateral 10/08/2019   Procedure: CESAREAN SECTION WITH BILATERAL TUBAL LIGATION;  Surgeon: Fredirick Glenys RAMAN, MD;  Location: [facility] LD ORS;  Service: Obstetrics;  Laterality: Bilateral;    No Known Allergies  Outpatient Encounter Medications as of 10/25/2024  Medication Sig   albuterol (VENTOLIN HFA) 108 (90 Base) MCG/ACT inhaler Inhale 2 puffs into the lungs every 6 (six) hours as needed for wheezing or shortness of breath.   azithromycin (ZITHROMAX) 250 MG tablet Take 2 tablets on day 1, then 1 tablet daily on days 2 through 5   Prenatal Vit-Fe Fumarate-FA (MULTIVITAMIN-PRENATAL) 27-0.8 MG TABS tablet Take 1 tablet by mouth daily at 12 noon.   [DISCONTINUED] benzonatate (TESSALON) 100 MG  capsule Take 100 mg by mouth 3 (three) times daily as needed for cough.   benzonatate (TESSALON) 100 MG capsule Take 1 capsule (100 mg total) by mouth 3 (three) times daily as needed for cough.   [DISCONTINUED] meloxicam  (MOBIC ) 15 MG tablet Take 1 tablet (15 mg total) by mouth daily. (Patient not taking: Reported on 10/25/2024)   [DISCONTINUED] methylPREDNISolone  (MEDROL  DOSEPAK) 4 MG TBPK tablet 6 day dose pack - take as directed (Patient not taking: Reported on 10/25/2024)   No facility-administered  encounter medications on file as of 10/25/2024.    Review of Systems:  Review of Systems  Constitutional:  Negative for appetite change, chills, fatigue and fever.  HENT:  Negative for congestion, hearing loss, rhinorrhea and sore throat.   Eyes: Negative.   Respiratory:  Positive for cough and wheezing. Negative for shortness of breath.   Cardiovascular:  Negative for chest pain, palpitations and leg swelling.  Gastrointestinal:  Negative for abdominal pain, constipation, diarrhea, nausea and vomiting.  Genitourinary:  Negative for dysuria.  Musculoskeletal:  Negative for arthralgias, back pain and myalgias.  Skin:  Negative for color change, rash and wound.  Neurological:  Negative for dizziness, weakness and headaches.  Psychiatric/Behavioral:  Negative for behavioral problems. The patient is not nervous/anxious.     Health Maintenance  Topic Date Due   Hepatitis B Vaccines 19-59 Average Risk (1 of 3 - 19+ 3-dose series) Never done   HPV VACCINES (1 - 3-dose SCDM series) Never done   Mammogram  Never done   Cervical Cancer Screening (HPV/Pap Cotest)  03/21/2024   COVID-19 Vaccine (1 - 2025-26 season) Never done   Influenza Vaccine  03/19/2025 (Originally 07/20/2024)   DTaP/Tdap/Td (3 - Td or Tdap) 09/18/2029   Hepatitis C Screening  Completed   HIV Screening  Completed   Pneumococcal Vaccine  Aged Out   Meningococcal B Vaccine  Aged Out    Physical Exam: Vitals:   10/25/24 0946  BP: 124/70  Pulse: 83  Temp: 97.8 F (36.6 C)  TempSrc: Temporal  SpO2: 97%  Weight: 243 lb 12.8 oz (110.6 kg)  Height: 5' 6 (1.676 m)   Body mass index is 39.35 kg/m. Physical Exam Constitutional:      Appearance: Normal appearance. She is obese.  HENT:     Head: Normocephalic and atraumatic.     Nose: Nose normal.     Mouth/Throat:     Mouth: Mucous membranes are moist.  Eyes:     Conjunctiva/sclera: Conjunctivae normal.  Cardiovascular:     Rate and Rhythm: Normal rate and regular  rhythm.  Pulmonary:     Effort: Pulmonary effort is normal.     Breath sounds: Normal breath sounds.  Abdominal:     General: Bowel sounds are normal.     Palpations: Abdomen is soft.  Musculoskeletal:        General: Normal range of motion.     Cervical back: Normal range of motion.  Skin:    General: Skin is warm and dry.     Findings: Rash present.     Comments: Rashes on face  Neurological:     General: No focal deficit present.     Mental Status: She is alert and oriented to person, place, and time.  Psychiatric:        Mood and Affect: Mood normal.        Behavior: Behavior normal.        Thought Content: Thought content normal.  Judgment: Judgment normal.     Labs reviewed: Basic Metabolic Panel: Recent Labs    05/29/24 1055  NA 139  K 4.4  CL 104  CO2 29  GLUCOSE 89  BUN 12  CREATININE 0.74  CALCIUM 8.8   Liver Function Tests: Recent Labs    05/29/24 1055  AST 16  ALT 17  BILITOT 0.6  PROT 6.9   No results for input(s): LIPASE, AMYLASE in the last 8760 hours. No results for input(s): AMMONIA in the last 8760 hours. CBC: Recent Labs    05/29/24 1055  WBC 6.5  NEUTROABS 4,069  HGB 13.2  HCT 41.6  MCV 87.2  PLT 225   Lipid Panel: Recent Labs    05/29/24 1055  CHOL 134  HDL 40*  LDLCALC 80  TRIG 64  CHOLHDL 3.4   Lab Results  Component Value Date   HGBA1C 5.3 05/29/2024    Procedures since last visit: No results found.  Assessment/Plan  1. Acute cough (Primary) Acute bronchitis, unspecified organism -  with productive cough and nocturnal wheezing. Asthma may predispose to bronchitis. Differential includes upper respiratory infection. - Prescribed albuterol inhaler for asthma management. - Prescribed Tessalon Perles for cough, three times a day as needed. - Prescribed Z-Pak for suspected bacterial bronchitis, two tablets on the first day, then one daily for four days. - Discussed potential side effects of Z-Pak,  including hypersensitivity reactions, ear ringing, and risk of C. diff infection. - Advised follow-up if symptoms persist. - albuterol (VENTOLIN HFA) 108 (90 Base) MCG/ACT inhaler; Inhale 2 puffs into the lungs every 6 (six) hours as needed for wheezing or shortness of breath.  Dispense: 8 g; Refill: 0 - benzonatate (TESSALON) 100 MG capsule; Take 1 capsule (100 mg total) by mouth 3 (three) times daily as needed for cough.  Dispense: 20 capsule; Refill: 0 - azithromycin (ZITHROMAX) 250 MG tablet; Take 2 tablets on day 1, then 1 tablet daily on days 2 through 5  Dispense: 6 tablet; Refill: 0  2. Morbidly obese (HCC) -  with recent weight loss from 255 lbs to 243 lbs. Goal is to reduce weight to below 200 lbs. - Continue current dietary and exercise regimen to achieve weight loss goals.  4. Rosacea -   Appears stress-related with improvement during reduced stress periods. Previous metronidazole  trial was ineffective.  -  Ambulatory referral to dermatology     Labs/tests ordered:  None   Return if symptoms worsen or fail to improve.  Kahlea Cobert Medina-Vargas, NP
